# Patient Record
Sex: Male | Born: 1990
Health system: Southern US, Community
[De-identification: ages and names within clinical notes are randomized; demographics above are authoritative.]

## PROBLEM LIST (undated history)

## (undated) DIAGNOSIS — L309 Dermatitis, unspecified: Secondary | ICD-10-CM

---

## 2015-06-22 ENCOUNTER — Encounter (HOSPITAL_COMMUNITY): Payer: Self-pay | Admitting: *Deleted

## 2015-06-22 ENCOUNTER — Emergency Department (HOSPITAL_COMMUNITY)
Admission: EM | Admit: 2015-06-22 | Discharge: 2015-06-22 | Disposition: A | Discharge status: Home / Self Care | Payer: BC Managed Care – PPO | Attending: Emergency Medicine | Admitting: Emergency Medicine

## 2015-06-22 ENCOUNTER — Emergency Department (HOSPITAL_COMMUNITY): Payer: BC Managed Care – PPO

## 2015-06-22 DIAGNOSIS — R1012 Left upper quadrant pain: Secondary | ICD-10-CM | POA: Diagnosis not present

## 2015-06-22 DIAGNOSIS — R1011 Right upper quadrant pain: Secondary | ICD-10-CM | POA: Diagnosis not present

## 2015-06-22 DIAGNOSIS — S29011A Strain of muscle and tendon of front wall of thorax, initial encounter: Secondary | ICD-10-CM | POA: Insufficient documentation

## 2015-06-22 DIAGNOSIS — R079 Chest pain, unspecified: Secondary | ICD-10-CM | POA: Diagnosis present

## 2015-06-22 DIAGNOSIS — M25512 Pain in left shoulder: Secondary | ICD-10-CM | POA: Insufficient documentation

## 2015-06-22 DIAGNOSIS — Y999 Unspecified external cause status: Secondary | ICD-10-CM | POA: Insufficient documentation

## 2015-06-22 DIAGNOSIS — Y939 Activity, unspecified: Secondary | ICD-10-CM | POA: Insufficient documentation

## 2015-06-22 DIAGNOSIS — M25511 Pain in right shoulder: Secondary | ICD-10-CM | POA: Insufficient documentation

## 2015-06-22 DIAGNOSIS — X58XXXA Exposure to other specified factors, initial encounter: Secondary | ICD-10-CM | POA: Insufficient documentation

## 2015-06-22 DIAGNOSIS — Y929 Unspecified place or not applicable: Secondary | ICD-10-CM | POA: Insufficient documentation

## 2015-06-22 DIAGNOSIS — T148XXA Other injury of unspecified body region, initial encounter: Secondary | ICD-10-CM

## 2015-06-22 LAB — COMPREHENSIVE METABOLIC PANEL
ALBUMIN: 3.8 g/dL (ref 3.5–5.0)
ALK PHOS: 68 U/L (ref 38–126)
ALT: 19 U/L (ref 17–63)
AST: 28 U/L (ref 15–41)
Anion gap: 10 (ref 5–15)
BILIRUBIN TOTAL: 0.7 mg/dL (ref 0.3–1.2)
BUN: 5 mg/dL — ABNORMAL LOW (ref 6–20)
CALCIUM: 9.7 mg/dL (ref 8.9–10.3)
CO2: 26 mmol/L (ref 22–32)
CREATININE: 0.91 mg/dL (ref 0.61–1.24)
Chloride: 103 mmol/L (ref 101–111)
GFR calc non Af Amer: >60 mL/min (ref >60)
GLUCOSE: 82 mg/dL (ref 65–99)
Potassium: 4.1 mmol/L (ref 3.5–5.1)
SODIUM: 139 mmol/L (ref 135–145)
TOTAL PROTEIN: 7 g/dL (ref 6.5–8.1)

## 2015-06-22 LAB — CBC WITH DIFFERENTIAL/PLATELET
BASOS PCT: 0 %
Basophils Absolute: 0 10*3/uL (ref 0.0–0.1)
EOS ABS: 2.5 10*3/uL — AB (ref 0.0–0.7)
EOS PCT: 24 %
HCT: 43.2 % (ref 39.0–52.0)
Hemoglobin: 14.5 g/dL (ref 13.0–17.0)
LYMPHS ABS: 1.7 10*3/uL (ref 0.7–4.0)
Lymphocytes Relative: 16 %
MCH: 27.3 pg (ref 26.0–34.0)
MCHC: 33.6 g/dL (ref 30.0–36.0)
MCV: 81.2 fL (ref 78.0–100.0)
MONO ABS: 0.5 10*3/uL (ref 0.1–1.0)
MONOS PCT: 5 %
Neutro Abs: 5.8 10*3/uL (ref 1.7–7.7)
Neutrophils Relative %: 55 %
PLATELETS: 309 10*3/uL (ref 150–400)
RBC: 5.32 MIL/uL (ref 4.22–5.81)
RDW: 12.8 % (ref 11.5–15.5)
WBC: 10.6 10*3/uL — ABNORMAL HIGH (ref 4.0–10.5)

## 2015-06-22 LAB — URINALYSIS, ROUTINE W REFLEX MICROSCOPIC
BILIRUBIN URINE: NEGATIVE
Glucose, UA: NEGATIVE mg/dL
HGB URINE DIPSTICK: NEGATIVE
KETONES UR: 15 mg/dL — AB
Leukocytes, UA: NEGATIVE
Nitrite: NEGATIVE
PROTEIN: NEGATIVE mg/dL
SPECIFIC GRAVITY, URINE: 1.017 (ref 1.005–1.030)
pH: 5.5 (ref 5.0–8.0)

## 2015-06-22 LAB — LIPASE, BLOOD: Lipase: 35 U/L (ref 11–51)

## 2015-06-22 LAB — CK: CK TOTAL: 96 U/L (ref 49–397)

## 2015-06-22 MED ORDER — KETOROLAC TROMETHAMINE 30 MG/ML IJ SOLN
30.0000 mg | Freq: Once | INTRAMUSCULAR | Status: AC
  Administered 2015-06-22: 30 mg via INTRAMUSCULAR
  Filled 2015-06-22: qty 1

## 2015-06-22 MED ORDER — TRAMADOL HCL 50 MG PO TABS
50.0000 mg | ORAL_TABLET | Freq: Once | ORAL | Status: AC
  Administered 2015-06-22: 50 mg via ORAL
  Filled 2015-06-22: qty 1

## 2015-06-22 MED ORDER — TRAMADOL HCL 50 MG PO TABS: 50.0000 mg | ORAL_TABLET | Freq: Four times a day (QID) | ORAL | Status: DC | PRN

## 2015-06-22 MED ORDER — PREDNISONE 10 MG PO TABS: ORAL_TABLET | ORAL | Status: DC

## 2015-06-22 NOTE — ED Provider Notes (Signed)
CSN: [id number]     Arrival date & time 06/22/15  1535 History   First MD Initiated Contact with Patient 06/22/15 1550     Chief Complaint  Patient presents with  . Abdominal Pain     (Consider location/radiation/quality/duration/timing/severity/associated sxs/prior Treatment) The history is provided by the patient.   Joshua Bentley is a 25 y.o. male presenting with a now almost 4 week history of abdominal chest and bilateral shoulder pain.  He describes a sharp and burning sensation in his abdomen which radiates into his upper chest and bilateral anterior shoulders with movement, no pain at rest.  [facility] was [facility] early this month at which time he had an abdominal CT scan along with basic labs which was negative for any acute processes, and was diagnosed with probable body wall muscular pain.  However he denies any reason for this diagnosis and persistent pain.  He does work in [employer] and is very active with his job with frequent lifting and bending, but is done this for a year without symptoms until now.  He denies fevers or chills, denies shortness of breath, wheezing, nausea or emesis.  Also denies dysuria, dark urine, light stools, diarrhea or constipation.  He was prescribed ibuprofen and Flexeril at his recent workup which has not improved his symptoms.    History reviewed. No pertinent past medical history. History reviewed. No pertinent past surgical history. History reviewed. No pertinent family history. Social History  Substance Use Topics  . Smoking status: Never Smoker   . Smokeless tobacco: Never Used  . Alcohol Use: Yes     Comment: social    Review of Systems  Constitutional: Negative for fever and chills.  HENT: Negative.   Respiratory: Negative for cough, shortness of breath and wheezing.   Cardiovascular: Positive for chest pain.  Gastrointestinal: Positive for abdominal pain. Negative for nausea, vomiting, diarrhea and constipation.    Musculoskeletal: Positive for arthralgias. Negative for joint swelling.  Skin: Negative for color change and rash.  Neurological: Negative for weakness and numbness.      Allergies  Review of patient's allergies indicates no known allergies.  Home Medications   Prior to Admission medications   Medication Sig Start Date End Date Taking? Authorizing Provider  predniSONE (DELTASONE) 10 MG tablet 6, 5, 4, 3, 2 then 1 tablet by mouth daily for 6 days total. 06/22/15   Burgess AmorJulie Yazaira Speas, PA-C  traMADol (ULTRAM) 50 MG tablet Take 1 tablet (50 mg total) by mouth every 6 (six) hours as needed. 06/22/15   Burgess AmorJulie Alys Dulak, PA-C   BP 147/83 mmHg  Pulse 86  Temp(Src) 98 F (36.7 C) (Oral)  Resp 20  SpO2 100% Physical Exam  Constitutional: He appears well-developed and well-nourished.  HENT:  Head: Normocephalic and atraumatic.  Eyes: Conjunctivae are normal.  Neck: Normal range of motion.  Cardiovascular: Normal rate, regular rhythm, S1 normal, normal heart sounds and intact distal pulses.   Pulmonary/Chest: Effort normal and breath sounds normal. He has no wheezes. He exhibits tenderness and bony tenderness. He exhibits no crepitus, no edema, no deformity and no swelling.    Tender to palpation bilateral upper anterior chest wall.  No edema, no rash.  Abdominal: Soft. Bowel sounds are normal. He exhibits no distension and no mass. There is no hepatosplenomegaly. There is tenderness in the right upper quadrant and left upper quadrant. There is no rebound, no guarding, no CVA tenderness, no tenderness at McBurney's point and negative Murphy's  sign.  Musculoskeletal: Normal range of motion.  Tender to palpation bilateral anterior shoulders.  There is no edema, crepitus, erythema.  He displays full range of motion which does exacerbate his shoulder and upper chest discomfort.  Neurological: He is alert.  Skin: Skin is warm and dry.  Psychiatric: He has a normal mood and affect.  Nursing note and vitals  reviewed.   ED Course  Procedures (including critical care time) Labs Review Labs Reviewed  COMPREHENSIVE METABOLIC PANEL - Abnormal; Notable for the following:    BUN 5 (*)    All other components within normal limits  CBC WITH DIFFERENTIAL/PLATELET - Abnormal; Notable for the following:    WBC 10.6 (*)    Eosinophils Absolute 2.5 (*)    All other components within normal limits  URINALYSIS, ROUTINE W REFLEX MICROSCOPIC (NOT AT [facility]) - Abnormal; Notable for the following:    Ketones, ur 15 (*)    All other components within normal limits  LIPASE, BLOOD  CK    Imaging Review Dg Abd Acute W/chest  06/22/2015  CLINICAL DATA:  Chest and abdominal pain for 2 weeks. EXAM: DG ABDOMEN ACUTE W/ 1V CHEST COMPARISON:  None. FINDINGS: There is no evidence of dilated bowel loops or free intraperitoneal air. No abnormal stool retention. No radiopaque calculi or other significant radiographic abnormality is seen. Heart size and mediastinal contours are within normal limits. Both lungs are clear. Mild thoracic dextroscoliosis. IMPRESSION: Negative abdominal radiographs.  No acute cardiopulmonary disease. Electronically Signed   By: Marnee Spring M.D.   On: 06/22/2015 18:43   I have personally reviewed and evaluated these images and lab results as part of my medical decision-making.   EKG Interpretation None      MDM   Final diagnoses:  Musculoskeletal strain    Suspect symptoms related to chest wall musculoskeletal strain. Sx are reproducible on exam including upper chest wall and anterior shoulder.   However given chronicity of symptoms and upper abdominal pain we'll check basic labs including total CK.  I have requested discharge summary and results of CT scanning that was completed at the outside hospital for same symptoms earlier this month.  Will prescribe tramadol, prednisone taper.  Advised heat tx.  Referrals given to establish pcp in this area.  Report from [facility]  including CT abdomen report obtained and negative work up.    Burgess Amor, PA-C 06/22/15 2028  Laurence Spates, MD 06/23/15 [phone]

## 2015-06-22 NOTE — ED Notes (Addendum)
Pt reports lt shoulder pain since 06-04-15. Pt reports he lifts heavy objects at work .  [facility]  [facility] on 06-05-15. Pt now reports pain has moved from stomach to Lt shoulder.

## 2016-07-08 ENCOUNTER — Encounter (HOSPITAL_COMMUNITY)
Admission: EM | Disposition: A | Discharge status: Home / Self Care | Payer: Self-pay | Source: Home / Self Care | Attending: Emergency Medicine

## 2016-07-08 ENCOUNTER — Ambulatory Visit (HOSPITAL_COMMUNITY)
Admission: EM | Admit: 2016-07-08 | Discharge: 2016-07-08 | Disposition: A | Discharge status: Home / Self Care | Payer: BC Managed Care – PPO | Attending: Emergency Medicine | Admitting: Emergency Medicine

## 2016-07-08 ENCOUNTER — Emergency Department (HOSPITAL_COMMUNITY): Payer: BC Managed Care – PPO

## 2016-07-08 ENCOUNTER — Encounter (HOSPITAL_COMMUNITY): Payer: Self-pay | Admitting: Emergency Medicine

## 2016-07-08 DIAGNOSIS — T18108A Unspecified foreign body in esophagus causing other injury, initial encounter: Secondary | ICD-10-CM

## 2016-07-08 DIAGNOSIS — R0989 Other specified symptoms and signs involving the circulatory and respiratory systems: Secondary | ICD-10-CM | POA: Diagnosis not present

## 2016-07-08 DIAGNOSIS — K222 Esophageal obstruction: Secondary | ICD-10-CM | POA: Diagnosis not present

## 2016-07-08 DIAGNOSIS — Z0389 Encounter for observation for other suspected diseases and conditions ruled out: Secondary | ICD-10-CM | POA: Diagnosis not present

## 2016-07-08 DIAGNOSIS — K449 Diaphragmatic hernia without obstruction or gangrene: Secondary | ICD-10-CM | POA: Insufficient documentation

## 2016-07-08 HISTORY — PX: ESOPHAGOGASTRODUODENOSCOPY: SHX5428

## 2016-07-08 SURGERY — EGD (ESOPHAGOGASTRODUODENOSCOPY)
Anesthesia: Moderate Sedation

## 2016-07-08 MED ORDER — MIDAZOLAM HCL 5 MG/ML IJ SOLN
INTRAMUSCULAR | Status: AC
  Filled 2016-07-08: qty 2

## 2016-07-08 MED ORDER — BUTAMBEN-TETRACAINE-BENZOCAINE 2-2-14 % EX AERO
INHALATION_SPRAY | CUTANEOUS | Status: DC | PRN
  Administered 2016-07-08: 2 via TOPICAL

## 2016-07-08 MED ORDER — LORAZEPAM 2 MG/ML IJ SOLN
1.0000 mg | Freq: Once | INTRAMUSCULAR | Status: AC
  Administered 2016-07-08: 1 mg via INTRAVENOUS
  Filled 2016-07-08: qty 1

## 2016-07-08 MED ORDER — MIDAZOLAM HCL 10 MG/2ML IJ SOLN
INTRAMUSCULAR | Status: DC | PRN
  Administered 2016-07-08: 1 mg via INTRAVENOUS
  Administered 2016-07-08 (×2): 2 mg via INTRAVENOUS

## 2016-07-08 MED ORDER — PANTOPRAZOLE SODIUM 40 MG PO TBEC: 20.0000 mg | DELAYED_RELEASE_TABLET | Freq: Every day | ORAL | 1 refills | Status: DC

## 2016-07-08 MED ORDER — DIPHENHYDRAMINE HCL 50 MG/ML IJ SOLN
INTRAMUSCULAR | Status: AC
  Filled 2016-07-08: qty 1

## 2016-07-08 MED ORDER — GLUCAGON HCL RDNA (DIAGNOSTIC) 1 MG IJ SOLR
1.0000 mg | Freq: Once | INTRAMUSCULAR | Status: AC
  Administered 2016-07-08: 1 mg via INTRAVENOUS
  Filled 2016-07-08: qty 1

## 2016-07-08 MED ORDER — FENTANYL CITRATE (PF) 100 MCG/2ML IJ SOLN
INTRAMUSCULAR | Status: DC | PRN
  Administered 2016-07-08 (×2): 25 ug via INTRAVENOUS

## 2016-07-08 MED ORDER — PANTOPRAZOLE SODIUM 40 MG IV SOLR
40.0000 mg | Freq: Once | INTRAVENOUS | Status: AC
  Administered 2016-07-08: 40 mg via INTRAVENOUS
  Filled 2016-07-08: qty 40

## 2016-07-08 MED ORDER — SODIUM CHLORIDE 0.9 % IV SOLN
INTRAVENOUS | Status: DC
  Administered 2016-07-08: 12:00:00 via INTRAVENOUS

## 2016-07-08 MED ORDER — FENTANYL CITRATE (PF) 100 MCG/2ML IJ SOLN
INTRAMUSCULAR | Status: AC
  Filled 2016-07-08: qty 4

## 2016-07-08 NOTE — ED Notes (Signed)
ED Provider at bedside. 

## 2016-07-08 NOTE — ED Provider Notes (Signed)
[facility]-EMERGENCY DEPT Provider Note   CSN: [id number] Arrival date & time: 07/08/16  0636     History   Chief Complaint Chief Complaint  Patient presents with  . Swallowed Foreign Body    HPI Joshua Bentley is a 26 y.o. male.  HPI Patient reports swallowing chicken at approximately 1:30 last night and has been unable to swallow since then.  He is unable to keep his secretions down.  He reports his throat hurts.  He can not drink water at the bedside without the column of water coming back up.  No prior history of gastroesophageal reflux disease.  No prior history of strictures.  His girlfriend states that he takes very large bites   History reviewed. No pertinent past medical history.  There are no active problems to display for this patient.   History reviewed. No pertinent surgical history.     Home Medications    Prior to Admission medications   Medication Sig Start Date End Date Taking? Authorizing Provider  predniSONE (DELTASONE) 10 MG tablet 6, 5, 4, 3, 2 then 1 tablet by mouth daily for 6 days total. Patient not taking: Reported on 07/08/2016 06/22/15   Burgess Amor, PA-C  traMADol (ULTRAM) 50 MG tablet Take 1 tablet (50 mg total) by mouth every 6 (six) hours as needed. Patient not taking: Reported on 07/08/2016 06/22/15   Burgess Amor, PA-C    Family History History reviewed. No pertinent family history.  Social History Social History  Substance Use Topics  . Smoking status: Never Smoker  . Smokeless tobacco: Never Used  . Alcohol use Yes     Comment: social     Allergies   Patient has no known allergies.   Review of Systems Review of Systems  All other systems reviewed and are negative.    Physical Exam Updated Vital Signs There were no vitals taken for this visit.  Physical Exam  Constitutional: He is oriented to person, place, and time. He appears well-developed and well-nourished.  HENT:  Head: Normocephalic.  Posterior pharynx is normal.   Uvula normal.  Eyes: EOM are normal.  Neck: Normal range of motion. Neck supple.  Cardiovascular: Normal rate.   Pulmonary/Chest: Effort normal.  Abdominal: He exhibits no distension.  Musculoskeletal: Normal range of motion.  Neurological: He is alert and oriented to person, place, and time.  Psychiatric: He has a normal mood and affect.  Nursing note and vitals reviewed.    ED Treatments / Results  Labs (all labs ordered are listed, but only abnormal results are displayed) Labs Reviewed - No data to display  EKG  EKG Interpretation None       Radiology Dg Chest 2 View  Result Date: 07/08/2016 CLINICAL DATA:  Dysphagia EXAM: CHEST  2 VIEW COMPARISON:  Jun 22, 2015 FINDINGS: The lungs are clear. The heart size and pulmonary vascularity are normal. No adenopathy. No pneumothorax or pneumomediastinum. No mediastinal widening evident. Bony structures appear unremarkable. There is mid thoracic dextroscoliosis. IMPRESSION: No edema or consolidation. No pneumomediastinum or pneumothorax. Stable cardiac and mediastinal silhouette. Electronically Signed   By: Bretta Bang III M.D.   On: 07/08/2016 07:46    Procedures Procedures (including critical care time)  Medications Ordered in ED Medications  glucagon (human recombinant) (GLUCAGEN) injection 1 mg (1 mg Intravenous Given 07/08/16 0804)  LORazepam (ATIVAN) injection 1 mg (1 mg Intravenous Given 07/08/16 0804)     Initial Impression / Assessment and Plan / ED Course  I have reviewed the  triage vital signs and the nursing notes.  Pertinent labs & imaging results that were available during my care of the patient were reviewed by me and considered in my medical decision making (see chart for details).     Posterior pharynx and anterior neck are normal.  Chest x-ray normal.  No response to glucagon and benzodiazepine.  Attempt to give the patient water and as he drank the water it began to stay down but then shortly after an  entire column of water came back up into the vomit bag.  I spoke with on-call [facility] gastroenterology who will evaluate the patient at bedside for possible upper endoscopy for esophageal food impaction  Final Clinical Impressions(s) / ED Diagnoses   Final diagnoses:  None    New Prescriptions New Prescriptions   No medications on file     Azalia Bilisampos, Rhoderick Farrel, MD 07/08/16 [phone]

## 2016-07-08 NOTE — Consult Note (Signed)
Referring Provider: Dr. Roylene Reasonampos/ER Primary Care Physician:  Patient, No Pcp Per Primary Gastroenterologist:  Gentry FitzUnassigned  Reason for Consultation:  Food impaction  HPI: Joshua Bentley is a 26 y.o. male [facility] with sensation of food stuck in the esophagus. Patient was retained chicken at around 1:30 in the morning and felt it is not going down. Patient was initially doing fine then started having nausea and vomiting around 5:30 AM. He was brought into the ER. He was given trial of liquid which resulted in vomiting. Patient seen and examined at bedside. He continues to have sensation of something stuck in the esophagus.  Denied any other GI symptoms. Denied history of trouble swallowing in the past. Denied any previous endoscopic workup. Denied NSAID use. Patient is complaining of fatigue because he hasn't slept overnight.  No family history of colon cancer.  History reviewed. No pertinent past medical history.  History reviewed. No pertinent surgical history.  Prior to Admission medications   Medication Sig Start Date End Date Taking? Authorizing Provider  predniSONE (DELTASONE) 10 MG tablet 6, 5, 4, 3, 2 then 1 tablet by mouth daily for 6 days total. Patient not taking: Reported on 07/08/2016 06/22/15   Burgess AmorIdol, Julie, PA-C  traMADol (ULTRAM) 50 MG tablet Take 1 tablet (50 mg total) by mouth every 6 (six) hours as needed. Patient not taking: Reported on 07/08/2016 06/22/15   Burgess AmorIdol, Julie, PA-C    Scheduled Meds: . pantoprazole (PROTONIX) IV  40 mg Intravenous Once   Continuous Infusions: PRN Meds:.  Allergies as of 07/08/2016  . (No Known Allergies)    History reviewed. No pertinent family history.  Social History   Social History  . Marital status: Single    Spouse name: N/A  . Number of children: N/A  . Years of education: N/A   Occupational History  . Not on file.   Social History Main Topics  . Smoking status: Never Smoker  . Smokeless  tobacco: Never Used  . Alcohol use Yes     Comment: social  . Drug use: No  . Sexual activity: Not on file   Other Topics Concern  . Not on file   Social History Narrative  . No narrative on file    Review of Systems:  Review of Systems  Constitutional: Positive for malaise/fatigue. Negative for chills, fever and weight loss.  HENT: Negative for ear discharge, ear pain, hearing loss and tinnitus.   Eyes: Negative for blurred vision, double vision, photophobia and pain.  Respiratory: Negative for cough and hemoptysis.   Cardiovascular: Negative for chest pain and palpitations.  Gastrointestinal: Positive for abdominal pain, nausea and vomiting. Negative for constipation and diarrhea.  Genitourinary: Negative for dysuria and urgency.  Musculoskeletal: Negative for myalgias and neck pain.  Skin: Negative for rash.  Neurological: Negative for speech change, focal weakness and seizures.  Endo/Heme/Allergies: Does not bruise/bleed easily.  Psychiatric/Behavioral: Negative for hallucinations and suicidal ideas.    Physical Exam: Vital signs: There were no vitals filed for this visit.   General:   Alert, somewhat sleepy but able to make conversation Well-developed, well-nourished, pleasant and cooperative in NAD HEENT: Normocephalic, atraumatic, extraocular movement intact. No oral lesion. Lungs:  Clear throughout to auscultation.   No wheezes, crackles, or rhonchi. No acute distress. Heart:  Regular rate and rhythm; no murmurs, clicks, rubs,  or gallops. Abdomen: Soft, nontender, nondistended, bowel stent present LE: No edema. Pulses present Psych. Mood and affect normal. Judgment normal  GI:  Lab Results: No results for input(s): WBC, HGB, HCT, PLT in the last 72 hours. BMET No results for input(s): NA, K, CL, CO2, GLUCOSE, BUN, CREATININE, CALCIUM in the last 72 hours. LFT No results for input(s): PROT, ALBUMIN, AST, ALT, ALKPHOS, BILITOT, BILIDIR, IBILI in the last 72  hours. PT/INR No results for input(s): LABPROT, INR in the last 72 hours.   Studies/Results: Dg Chest 2 View  Result Date: 07/08/2016 CLINICAL DATA:  Dysphagia EXAM: CHEST  2 VIEW COMPARISON:  Jun 22, 2015 FINDINGS: The lungs are clear. The heart size and pulmonary vascularity are normal. No adenopathy. No pneumothorax or pneumomediastinum. No mediastinal widening evident. Bony structures appear unremarkable. There is mid thoracic dextroscoliosis. IMPRESSION: No edema or consolidation. No pneumomediastinum or pneumothorax. Stable cardiac and mediastinal silhouette. Electronically Signed   By: Bretta Bang III M.D.   On: 07/08/2016 07:46    Impression/Plan: - Food impaction.  Recommendations -------------------------- - EGD today. Risks benefits alternatives discussed with the patient and girlfriend. Verbalized understanding. - According to girlfriend, patient has braces and he is not chewing his food properly and is eating large bites. Patient was advised to chew food well and moist food before swallowing. Further plan based on endoscopic finding    LOS: 0 days   Kathi Der  MD, FACP 07/08/2016, 11:06 AM  Pager [phone] If no answer or after 5 PM call [phone]

## 2016-07-08 NOTE — ED Triage Notes (Signed)
0130 While at work pt ate chicken ad felt like it did not go completely down his throat.0530 pt began having increased difficulty swallowing and having increases throat discomfort

## 2016-07-08 NOTE — Brief Op Note (Addendum)
07/08/2016  12:59 PM  PATIENT:  Joshua Bentley  26 y.o. male  PRE-OPERATIVE DIAGNOSIS:  Food impaction  POST-OPERATIVE DIAGNOSIS:  Lower esophageal ring/ no food bolus  PROCEDURE:  Procedure(s): ESOPHAGOGASTRODUODENOSCOPY (EGD) (N/A)  SURGEON:  Surgeon(s) and Role:    * Navneet Schmuck, MD - Primary  Findings/recommendations -------------------------------------- - EGD showed small amount of liquid in the esophagus which was suctioned. I did not identify any food bolus. -  patent lower esophageal ring - Procedure was difficult because of poor patient cooperation and agitation. Dilatation was not performed because of poor patient cooperation. - Recommend once a day PPI for  lower esophageal ring. - Given retained fluid in the esophagus, patient may have a motility disorder. - Recommend follow-up in GI clinic in 4-6 weeks after discharge. - May need outpatient motility study as well as esophageal dilatation if symptoms persist - GI will sign off. Call us back if needed. Okay to discharge from GI standpoint    Kathi DerParag Shian Goodnow MD, FACP 07/08/2016, 1:01 PM  Pager [phone]  If no answer or after 5 PM call [phone]

## 2016-07-08 NOTE — ED Notes (Signed)
Bed: EA54WA23 Expected date:  Expected time:  Means of arrival:  Comments: Endo

## 2016-07-08 NOTE — ED Notes (Signed)
Endo at bedside

## 2016-07-08 NOTE — Op Note (Signed)
[facility] Patient Name: Joshua Bentley Procedure Date: 07/08/2016 MRN: [id number] Attending MD: Kathi Der , MD Date of Birth: 1991-01-19 CSN: [id number] Age: 26 Admit Type: Inpatient Procedure:                Upper GI endoscopy Indications:              Foreign body in the esophagus Providers:                Kathi Der, MD, Carin Hock, RN, Margo Aye, Technician Referring MD:              Medicines:                Fentanyl 50 micrograms IV, Midazolam 5 mg IV Complications:            No immediate complications. Estimated Blood Loss:     Estimated blood loss: none. Procedure:                Pre-Anesthesia Assessment:                           - Prior to the procedure, a History and Physical                            was performed, and patient medications and                            allergies were reviewed. The patient's tolerance of                            previous anesthesia was also reviewed. The risks                            and benefits of the procedure and the sedation                            options and risks were discussed with the patient.                            All questions were answered, and informed consent                            was obtained. Prior Anticoagulants: The patient has                            taken no previous anticoagulant or antiplatelet                            agents. ASA Grade Assessment: I - A normal, healthy                            patient. After reviewing the risks and benefits,  the patient was deemed in satisfactory condition to                            undergo the procedure.                           After obtaining informed consent, the endoscope was                            passed under direct vision. Throughout the                            procedure, the patient's blood pressure, pulse, and   oxygen saturations were monitored continuously. The                            [id number]) scope was introduced through the                            mouth, and advanced to the second part of duodenum.                            The upper GI endoscopy was technically difficult                            and complex due to the patient's discomfort during                            the procedure, the patient's poor cooperation and                            the patient's agitation. The patient tolerated the                            procedure fairly well. Scope In: Scope Out: Findings:      Fluid was found in the middle third of the esophagus.      A widely patent Schatzki ring (acquired) was found at the       gastroesophageal junction.      There is no endoscopic evidence of esophagitis, stenosis, stricture or       ulcerations in the entire esophagus.      A small hiatal hernia was present.      The entire examined stomach was normal.      The cardia and gastric fundus were normal on retroflexion.      The duodenal bulb, first portion of the duodenum and second portion of       the duodenum were normal. Impression:               - Fluid in the middle third of the esophagus.                           - Widely patent Schatzki ring.                           - Small hiatal hernia.                           -  Normal stomach.                           - Normal duodenal bulb, first portion of the                            duodenum and second portion of the duodenum.                           - No specimens collected. Moderate Sedation:      Moderate (conscious) sedation was administered by the endoscopy nurse       and supervised by the endoscopist. The following parameters were       monitored: oxygen saturation, heart rate, blood pressure, and response       to care. Recommendation:           - Patient has a contact number available for                            emergencies.  The signs and symptoms of potential                            delayed complications were discussed with the                            patient. Return to normal activities tomorrow.                            Written discharge instructions were provided to the                            patient.                           - Resume previous diet.                           - Use Protonix (pantoprazole) 40 mg PO daily for 8                            weeks.                           - Soft diet.                           - Return to my office in 6 weeks.                           - Repeat upper endoscopy PRN for retreatment.                           - Perform ambulatory esophageal manometry if                            symptoms persist.                           -  Continue present medications.                           - Discontinue aspirin and NSAIDs. Procedure Code(s):        --- Professional ---                           [phone], Esophagogastroduodenoscopy, flexible,                            transoral; diagnostic, including collection of                            specimen(s) by brushing or washing, when performed                            (separate procedure) Diagnosis Code(s):        --- Professional ---                           K22.2, Esophageal obstruction                           K44.9, Diaphragmatic hernia without obstruction or                            gangrene                           T18.108A, Unspecified foreign body in esophagus                            causing other injury, initial encounter CPT copyright 2016 [employer]. All rights reserved. The codes documented in this report are preliminary and upon coder review may  be revised to meet current compliance requirements. Kathi Der, MD Kathi Der, MD 07/08/2016 12:59:02 PM Number of Addenda: 0

## 2016-07-09 ENCOUNTER — Encounter (HOSPITAL_COMMUNITY): Payer: Self-pay | Admitting: Gastroenterology

## 2017-02-04 ENCOUNTER — Encounter (HOSPITAL_COMMUNITY): Payer: Self-pay | Admitting: Emergency Medicine

## 2017-02-04 ENCOUNTER — Emergency Department (HOSPITAL_COMMUNITY)
Admission: EM | Admit: 2017-02-04 | Discharge: 2017-02-04 | Disposition: A | Discharge status: Home / Self Care | Payer: BC Managed Care – PPO | Attending: Emergency Medicine | Admitting: Emergency Medicine

## 2017-02-04 DIAGNOSIS — R634 Abnormal weight loss: Secondary | ICD-10-CM | POA: Insufficient documentation

## 2017-02-04 DIAGNOSIS — L309 Dermatitis, unspecified: Secondary | ICD-10-CM | POA: Insufficient documentation

## 2017-02-04 HISTORY — DX: Dermatitis, unspecified: L30.9

## 2017-02-04 LAB — CBC WITH DIFFERENTIAL/PLATELET
BASOS PCT: 1 %
Basophils Absolute: 0.1 10*3/uL (ref 0.0–0.1)
EOS ABS: 2.1 10*3/uL — AB (ref 0.0–0.7)
Eosinophils Relative: 28 %
HCT: 41.7 % (ref 39.0–52.0)
Hemoglobin: 14.2 g/dL (ref 13.0–17.0)
LYMPHS ABS: 1.6 10*3/uL (ref 0.7–4.0)
Lymphocytes Relative: 21 %
MCH: 28.5 pg (ref 26.0–34.0)
MCHC: 34.1 g/dL (ref 30.0–36.0)
MCV: 83.6 fL (ref 78.0–100.0)
MONO ABS: 0.4 10*3/uL (ref 0.1–1.0)
Monocytes Relative: 6 %
NEUTROS PCT: 44 %
Neutro Abs: 3.2 10*3/uL (ref 1.7–7.7)
PLATELETS: 352 10*3/uL (ref 150–400)
RBC: 4.99 MIL/uL (ref 4.22–5.81)
RDW: 12.8 % (ref 11.5–15.5)
WBC: 7.4 10*3/uL (ref 4.0–10.5)

## 2017-02-04 LAB — COMPREHENSIVE METABOLIC PANEL
ALT: 14 U/L — AB (ref 17–63)
ANION GAP: 6 (ref 5–15)
AST: 22 U/L (ref 15–41)
Albumin: 4.1 g/dL (ref 3.5–5.0)
Alkaline Phosphatase: 58 U/L (ref 38–126)
BUN: 9 mg/dL (ref 6–20)
CHLORIDE: 105 mmol/L (ref 101–111)
CO2: 26 mmol/L (ref 22–32)
Calcium: 9.1 mg/dL (ref 8.9–10.3)
Creatinine, Ser: 0.68 mg/dL (ref 0.61–1.24)
Glucose, Bld: 92 mg/dL (ref 65–99)
POTASSIUM: 3.8 mmol/L (ref 3.5–5.1)
SODIUM: 137 mmol/L (ref 135–145)
Total Bilirubin: 0.4 mg/dL (ref 0.3–1.2)
Total Protein: 7.7 g/dL (ref 6.5–8.1)

## 2017-02-04 MED ORDER — PREDNISONE 50 MG PO TABS: ORAL_TABLET | ORAL | 0 refills | Status: DC

## 2017-02-04 MED ORDER — PREDNISONE 20 MG PO TABS
60.0000 mg | ORAL_TABLET | Freq: Once | ORAL | Status: AC
  Administered 2017-02-04: 60 mg via ORAL
  Filled 2017-02-04: qty 3

## 2017-02-04 NOTE — Discharge Instructions (Signed)
Please follow with your primary care doctor in the next 2 days for a check-up. They must obtain records for further management.  ° °Do not hesitate to return to the Emergency Department for any new, worsening or concerning symptoms.  ° °

## 2017-02-04 NOTE — ED Provider Notes (Signed)
[facility]-EMERGENCY DEPT Provider Note   CSN: [id number] Arrival date & time: 02/04/17  1257     History   Chief Complaint Chief Complaint  Patient presents with  . Pruritis  . Anorexia    HPI   Blood pressure 136/81, pulse 77, temperature 98.3 F (36.8 C), temperature source Oral, resp. rate 14, height 5\' 9"  (1.753 m), weight 74.2 kg (163 lb 8 oz), SpO2 100 %.  Joshua Bentley is a 27 y.o. male complaining of eczema exacerbation with associated pruritus he also has myalgia he was told was secondary to the inflammation from his eczema, he saw a dermatologist to give him a prescription for prednisone this resolved the myalgia and joint pain of note, patient states that he has had an unintentional 30 pound weight loss in the last 2-3 months.  He denies any cough, night sweats, easy bruising or bleeding.  He is not a smoker.  He denies any focal chest pain or abdominal pain.   Past Medical History:  Diagnosis Date  . Eczema     There are no active problems to display for this patient.   Past Surgical History:  Procedure Laterality Date  . ESOPHAGOGASTRODUODENOSCOPY N/A 07/08/2016   Procedure: ESOPHAGOGASTRODUODENOSCOPY (EGD);  Surgeon: Kathi Der, MD;  Location: [facility];  Service: Gastroenterology;  Laterality: N/A;       Home Medications    Prior to Admission medications   Medication Sig Start Date End Date Taking? Authorizing Provider  predniSONE (DELTASONE) 50 MG tablet Take 1 tablet Bentley with breakfast 02/04/17   Milad Bublitz, Joni Reining, PA-C    Family History No family history on file.  Social History Social History   Tobacco Use  . Smoking status: Never Smoker  . Smokeless tobacco: Never Used  Substance Use Topics  . Alcohol use: Yes    Comment: social  . Drug use: No     Allergies   Patient has no known allergies.   Review of Systems Review of Systems  A complete review of systems was obtained and all systems are  negative except as noted in the HPI and PMH.    Physical Exam Updated Vital Signs BP 136/81   Pulse 77   Temp 98.3 F (36.8 C) (Oral)   Resp 14   Ht 5\' 9"  (1.753 m)   Wt 74.2 kg (163 lb 8 oz)   SpO2 100%   BMI 24.14 kg/m   Physical Exam  Constitutional: He is oriented to person, place, and time. He appears well-developed and well-nourished. No distress.  HENT:  Head: Normocephalic and atraumatic.  Mouth/Throat: Oropharynx is clear and moist.  Eyes: Conjunctivae and EOM are normal. Pupils are equal, round, and reactive to light.  Neck: Normal range of motion.  Cardiovascular: Normal rate, regular rhythm and intact distal pulses.  Pulmonary/Chest: Effort normal and breath sounds normal.  Abdominal: Soft. There is no tenderness.  Musculoskeletal: Normal range of motion.  Neurological: He is alert and oriented to person, place, and time.  Skin: He is not diaphoretic.  Severe eczema over extensor surfaces  Psychiatric: He has a normal mood and affect.  Nursing note and vitals reviewed.    ED Treatments / Results  Labs (all labs ordered are listed, but only abnormal results are displayed) Labs Reviewed  CBC WITH DIFFERENTIAL/PLATELET - Abnormal; Notable for the following components:      Result Value   Eosinophils Absolute 2.1 (*)    All other components within normal limits  COMPREHENSIVE METABOLIC  PANEL - Abnormal; Notable for the following components:   ALT 14 (*)    All other components within normal limits    EKG  EKG Interpretation None       Radiology No results found.  Procedures Procedures (including critical care time)  Medications Ordered in ED Medications  predniSONE (DELTASONE) tablet 60 mg (60 mg Oral Given 02/04/17 1416)     Initial Impression / Assessment and Plan / ED Course  I have reviewed the triage vital signs and the nursing notes.  Pertinent labs & imaging results that were available during my care of the patient were reviewed by me  and considered in my medical decision making (see chart for details).     Vitals:   02/04/17 1326 02/04/17 1328  BP:  136/81  Pulse:  77  Resp:  14  Temp:  98.3 F (36.8 C)  TempSrc:  Oral  SpO2:  100%  Weight: 74.2 kg (163 lb 8 oz)   Height: 5\' 9"  (1.753 m)     Medications  predniSONE (DELTASONE) tablet 60 mg (60 mg Oral Given 02/04/17 1416)    Joshua Bentley is 27 y.o. male presenting with eczema exacerbation with unintentional weight loss, no other B symptoms.  Physical exam vital signs reassuring, patient has no outpatient primary care, case management consulted to try to establish primary care for further workup of his unintentional weight loss, he is given a referral to the wellness center.  Will start prednisone burst for treatment of his eczema.   Evaluation does not show pathology that would require ongoing emergent intervention or inpatient treatment. Pt is hemodynamically stable and mentating appropriately. Discussed findings and plan with patient/guardian, who agrees with care plan. All questions answered. Return precautions discussed and outpatient follow up given.      Final Clinical Impressions(s) / ED Diagnoses   Final diagnoses:  Unintentional weight loss  Eczema, unspecified type    ED Discharge Orders        Ordered    predniSONE (DELTASONE) 50 MG tablet     02/04/17 1517       Elana Jian, Mardella Laymanicole, PA-C 02/04/17 1608    Derwood KaplanNanavati, Ankit, MD 02/05/17 [phone]

## 2017-02-04 NOTE — Care Management Note (Signed)
Case Management Note  CM consulted for no pcp and no ins.  Spoke with pt and discussed the two clinic options that would be on his AVS.  Advised him to contact them and schedule an appointment for follow up.  Pt understood.  No further CM needs noted at this time.

## 2017-02-04 NOTE — ED Triage Notes (Signed)
Patient c/o generalized pain and itching since August last year. States he saw dermatologist and was given prednisone for excema. Pt c/o lack of appetite and weight loss due to now eating.

## 2017-02-05 ENCOUNTER — Telehealth: Payer: Self-pay

## 2017-02-05 NOTE — Telephone Encounter (Signed)
Message received from Eldridge AbrahamsAngela Kritzer, RN CM noting the the patient needs a hospital follow up appointment. It is noted [facility]  Update provided to Breck CoonsA. Kritzer, RN CM

## 2017-02-12 ENCOUNTER — Encounter: Payer: Self-pay | Admitting: Family Medicine

## 2017-02-12 ENCOUNTER — Ambulatory Visit (INDEPENDENT_AMBULATORY_CARE_PROVIDER_SITE_OTHER): Payer: Self-pay | Admitting: Family Medicine

## 2017-02-12 VITALS — BP 124/80 | HR 80 | Temp 98.1°F | Resp 16 | Ht 69.0 in | Wt 159.4 lb

## 2017-02-12 DIAGNOSIS — L2082 Flexural eczema: Secondary | ICD-10-CM

## 2017-02-12 DIAGNOSIS — R634 Abnormal weight loss: Secondary | ICD-10-CM

## 2017-02-12 DIAGNOSIS — Z131 Encounter for screening for diabetes mellitus: Secondary | ICD-10-CM

## 2017-02-12 DIAGNOSIS — F329 Major depressive disorder, single episode, unspecified: Secondary | ICD-10-CM

## 2017-02-12 DIAGNOSIS — F32A Depression, unspecified: Secondary | ICD-10-CM

## 2017-02-12 LAB — POCT URINALYSIS DIP (DEVICE)
Bilirubin Urine: NEGATIVE
Glucose, UA: NEGATIVE mg/dL
HGB URINE DIPSTICK: NEGATIVE
Ketones, ur: NEGATIVE mg/dL
NITRITE: NEGATIVE
PH: 5.5 (ref 5.0–8.0)
Protein, ur: NEGATIVE mg/dL
Specific Gravity, Urine: 1.015 (ref 1.005–1.030)
UROBILINOGEN UA: 0.2 mg/dL (ref 0.0–1.0)

## 2017-02-12 LAB — POCT GLYCOSYLATED HEMOGLOBIN (HGB A1C): HEMOGLOBIN A1C: 5.1

## 2017-02-12 MED ORDER — METHYLPREDNISOLONE SODIUM SUCC 125 MG IJ SOLR
125.0000 mg | Freq: Once | INTRAMUSCULAR | Status: AC
  Administered 2017-02-12: 125 mg via INTRAMUSCULAR

## 2017-02-12 MED ORDER — TRIAMCINOLONE ACETONIDE 0.1 % EX CREA: 1.0000 "application " | TOPICAL_CREAM | Freq: Two times a day (BID) | CUTANEOUS | 0 refills | Status: DC

## 2017-02-12 MED ORDER — ESCITALOPRAM OXALATE 10 MG PO TABS: 10.0000 mg | ORAL_TABLET | Freq: Every day | ORAL | 0 refills | Status: DC

## 2017-02-12 MED ORDER — PREDNISONE 20 MG PO TABS: ORAL_TABLET | ORAL | 0 refills | Status: DC

## 2017-02-12 MED FILL — predniSONE 20 MG TABS: 20 | 18 days supply | Qty: 18 | Fill #0

## 2017-02-12 MED FILL — TRIAMCINOLONE ACETONIDE 0.1: 0.1 | 30 days supply | Qty: 454 | Fill #0

## 2017-02-12 MED FILL — ESCITALOPRAM 10 MG TABLET: 10 | 30 days supply | Qty: 30 | Fill #0

## 2017-02-12 NOTE — Patient Instructions (Addendum)
Take Prednisone 20 mg,  in mornings with breakfast as follows:  Take 3 pills for 3 days, Take 2 pills for 3 days, and Take 1 pill for 3 days.  Complete all medication.   Mix Kenalog cream with Vaseline apply to entire body.  Lexapro 10 mg once daily at bedtime for depression. If symptoms worsen or do improve, return for care. I will see you in 6 weeks for follow-up.    Atopic Dermatitis Atopic dermatitis is a skin disorder that causes inflammation of the skin. This is the most common type of eczema. Eczema is a group of skin conditions that cause the skin to be itchy, red, and swollen. This condition is generally worse during the cooler winter months and often improves during the warm summer months. Symptoms can vary from person to person. Atopic dermatitis usually starts showing signs in infancy and can last through adulthood. This condition cannot be passed from one person to another (non-contagious), but it is more common in families. Atopic dermatitis may not always be present. When it is present, it is called a flare-up. What are the causes? The exact cause of this condition is not known. Flare-ups of the condition may be triggered by:  Contact with something that you are sensitive or allergic to.  Stress.  Certain foods.  Extremely hot or cold weather.  Harsh chemicals and soaps.  Dry air.  Chlorine.  What increases the risk? This condition is more likely to develop in people who have a personal history or family history of eczema, allergies, asthma, or hay fever. What are the signs or symptoms? Symptoms of this condition include:  Dry, scaly skin.  Red, itchy rash.  Itchiness, which can be severe. This may occur before the skin rash. This can make sleeping difficult.  Skin thickening and cracking that can occur over time.  How is this diagnosed? This condition is diagnosed based on your symptoms, a medical history, and a physical exam. How is this treated? There is  no cure for this condition, but symptoms can usually be controlled. Treatment focuses on:  Controlling the itchiness and scratching. You may be given medicines, such as antihistamines or steroid creams.  Limiting exposure to things that you are sensitive or allergic to (allergens).  Recognizing situations that cause stress and developing a plan to manage stress.  If your atopic dermatitis does not get better with medicines, or if it is all over your body (widespread), a treatment using a specific type of light (phototherapy) may be used. Follow these instructions at home: Skin care  Keep your skin well-moisturized. Doing this seals in moisture and helps to prevent dryness. ? Use unscented lotions that have petroleum in them. ? Avoid lotions that contain alcohol or water. They can dry the skin.  Keep baths or showers short (less than 5 minutes) in warm water. Do not use hot water. ? Use mild, unscented cleansers for bathing. Avoid soap and bubble bath. ? Apply a moisturizer to your skin right after a bath or shower.  Do not apply anything to your skin without checking with your health care provider. General instructions  Dress in clothes made of cotton or cotton blends. Dress lightly because heat increases itchiness.  When washing your clothes, rinse your clothes twice so all of the soap is removed.  Avoid any triggers that can cause a flare-up.  Try to manage your stress.  Keep your fingernails cut short.  Avoid scratching. Scratching makes the rash and itchiness worse.  It may also result in a skin infection (impetigo) due to a break in the skin caused by scratching.  Take or apply over-the-counter and prescription medicines only as told by your health care provider.  Keep all follow-up visits as told by your health care provider. This is important.  Do not be around people who have cold sores or fever blisters. If you get the infection, it may cause your atopic dermatitis to  worsen. Contact a health care provider if:  Your itchiness interferes with sleep.  Your rash gets worse or it is not better within one week of starting treatment.  You have a fever.  You have a rash flare-up after having contact with someone who has cold sores or fever blisters. Get help right away if:  You develop pus or soft yellow scabs in the rash area. Summary  This condition causes a red rash and itchy, dry, scaly skin.  Treatment focuses on controlling the itchiness and scratching, limiting exposure to things that you are sensitive or allergic to (allergens), recognizing situations that cause stress, and developing a plan to manage stress.  Keep your skin well-moisturized.  Keep baths or showers shorter than 5 minutes and use warm water. Do not use hot water. This information is not intended to replace advice given to you by your health care provider. Make sure you discuss any questions you have with your health care provider. Document Released: 01/13/2000 Document Revised: 02/17/2016 Document Reviewed: 02/17/2016 Elsevier Interactive Patient Education  [employer].

## 2017-02-12 NOTE — Progress Notes (Signed)
Patient ID: Joshua Bentley, male    DOB: 1990-03-09, 27 y.o.   MRN: [id number]  PCP: Bing Neighbors, FNP  Chief Complaint  Patient presents with  . Establish Care  . Hospitalization Follow-up    Weight loss and body aches    Subjective:  HPI Joshua Bentley is a 27 y.o. male presents to establish care. Medical history significant for eczema and unintentional weight loss. He has received no routine primary care. He is a full time college student at [employer] he is currently taking the semester off due to stressful prior semester. During fall semester, he reports performing poorly academically. During this time, he loss his appetite and began to loose weight without trying in early October. Average weight in  Early 2018 was around 190 and at present he weighs 159. Admits to on-going depression and poor mood. 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  Since taking the semester out of school he feels less stressed. He complains today of ongoing chronic eczema. He has been treated by a dermatologist in the past with prednisone and steroidal creams. Eczema is chronically present, however with changes in weather, skin irritation and pruritis  worsens.  Social History   Socioeconomic History  . Marital status: Single    Spouse name: Not on file  . Number of children: Not on file  . Years of education: Not on file  . Highest education level: Not on file  Social Needs  . Financial resource strain: Not on file  . Food insecurity - worry: Not on file  . Food insecurity - inability: Not on file  . Transportation needs - medical: Not on file  . Transportation needs - non-medical: Not on file  Occupational History  . Not on file  Tobacco Use  . Smoking status: Never Smoker  . Smokeless tobacco: Never Used  Substance and Sexual Activity  . Alcohol use: Yes    Comment: social  . Drug use: No  . Sexual activity:  Not on file  Other Topics Concern  . Not on file  Social History Narrative  . Not on file    Family History  Problem Relation Age of Onset  . Cancer Maternal Grandmother      Review of Systems Constitutional: Negative for fever, chills, diaphoresis, activity change, appetite change and fatigue. HENT: Negative for ear pain, nosebleeds, congestion, facial swelling, rhinorrhea, neck pain, neck stiffness and ear discharge.  Eyes: Negative for pain, discharge, redness, itching and visual disturbance. Respiratory: Negative for cough, choking, chest tightness, shortness of breath, wheezing and stridor.  Cardiovascular: Negative for chest pain, palpitations and leg swelling. Gastrointestinal: Negative for abdominal distention. Genitourinary: Negative for dysuria, urgency, frequency, hematuria, flank pain, decreased urine volume, difficulty urinating and dyspareunia.  Musculoskeletal: Negative for back pain, joint swelling, arthralgia and gait problem. Integumentary:  Dry, itchy, skin arms, necks and back Neurological: Negative for dizziness, tremors, seizures, syncope, facial asymmetry, speech difficulty, weakness, light-headedness, numbness and headaches.  Hematological: Negative for adenopathy. Does not bruise/bleed easily. Psychiatric/Behavioral: Negative for hallucinations, behavioral problems, confusion, dysphoric mood, decreased concentration and agitation.    Prior to Admission medications   Medication Sig Start Date End Date Taking? Authorizing Provider  predniSONE (DELTASONE) 50 MG tablet Take 1 tablet daily with breakfast Patient not taking: Reported on 02/12/2017 02/04/17   Pisciotta, Mardella Layman    Past Medical, Surgical Family and Social History reviewed and updated.  Objective:   Today's Vitals   02/12/17 1426  BP: 124/80  Pulse: 80  Resp: 16  Temp: 98.1 F (36.7 C)  TempSrc: Oral  SpO2: 100%  Weight: 159 lb 6.4 oz (72.3 kg)  Height: 5\' 9"  (1.753 m)    Wt  Readings from Last 3 Encounters:  02/12/17 159 lb 6.4 oz (72.3 kg)  02/04/17 163 lb 8 oz (74.2 kg)    Physical Exam Constitutional: Patient appears well-developed and well-nourished. No distress. HENT: Normocephalic, atraumatic, External right and left ear normal. Oropharynx is clear and moist.  Eyes: Conjunctivae and EOM are normal. PERRLA, no scleral icterus. Neck: Normal ROM. Neck supple. No JVD. No tracheal deviation. No thyromegaly. CVS: RRR, S1/S2 +, no murmurs, no gallops, no carotid bruit.  Pulmonary: Effort and breath sounds normal, no stridor, rhonchi, wheezes, rales.  Abdominal: Soft. BS +, no distension, tenderness, rebound or guarding.  Musculoskeletal: Normal range of motion. No edema and no tenderness.  Lymphadenopathy: No lymphadenopathy noted, cervical, inguinal or axillary Neuro: Alert. Normal reflexes, muscle tone coordination. No cranial nerve deficit. Skin: Skin is warm, dry, macular, scaly lesions hands, bilateral arms and neck. Areas of excoriation noted.  Psychiatric: Normal mood and affect. Behavior, judgment, thought content normal.   Assessment & Plan:  1. Unintentional weight loss, unknown cause. Patient has experienced on-going depression and stressors related to college. Will check HIV- no known risks factors-no hx of IV drug use and or high risk sexual behaviors. Checking a hepatitis B panel and Thyroid functioning test.  2. Flexural eczema, chronic, unstable. Will administer 1 dose  methylPREDNISolone sodium succinate (SOLU-MEDROL) 125 mg/2 mL-Im. Start twice daily triamcinolone cream.  Tomorrow start prednisone taper times 9 days.  Patient should take as directed.  If no improvement return for care.  Patient given a [place] financial assistance application in order to receive a referral to dermatology once approved.  3. Screening for diabetes mellitus, A1C 5.1, normal. Repeat in 12 months.  4. Depression, unspecified depression type, at present he is not  interested in therapy as he does not have any insurance.  Will trial on escitalopram 10 mg daily at bedtime hopefully to facilitate rest, improve mood, and increase appetite as is a known side effect of SSRIs.  Patient will return in 6 weeks to evaluate effectiveness of treatment.   Meds ordered this encounter  Medications  . methylPREDNISolone sodium succinate (SOLU-MEDROL) 125 mg/2 mL injection 125 mg  . escitalopram (LEXAPRO) 10 MG tablet    Sig: Take 1 tablet (10 mg total) by mouth at bedtime.    Dispense:  60 tablet    Refill:  0    Order Specific Question:   Supervising Provider    Answer:   Quentin Angst [id number]  . predniSONE (DELTASONE) 20 MG tablet    Sig: Take 3 pills for 3 days, Take 2 pills for 3 days, and Take 1 pill for 3 days.    Dispense:  18 tablet    Refill:  0    Order Specific Question:   Supervising Provider    Answer:   Quentin Angst [id number]  . triamcinolone cream (KENALOG) 0.1 %    Sig: Apply 1 application topically 2 (two) times daily.    Dispense:  454 g    Refill:  0    Order Specific Question:   Supervising Provider    Answer:   Quentin Angst [id number]   Orders Placed This Encounter  Procedures  . HIV antibody  .  Thyroid Panel With TSH  . Hepatitis B surface antigen  . Hepatitis B surface antibody  . POCT glycosylated hemoglobin (Hb A1C)  . POCT urinalysis dip (device)   RTC: Return in 6 weeks for depression and eczema follow-up.   Godfrey PickKimberly S. Tiburcio PeaHarris, MSN, FNP-C [facility]  [address] [place], [place] [address] [phone]

## 2017-02-13 LAB — THYROID PANEL WITH TSH
Free Thyroxine Index: 1.6 (ref 1.2–4.9)
T3 Uptake Ratio: 23 % — ABNORMAL LOW (ref 24–39)
T4 TOTAL: 6.9 ug/dL (ref 4.5–12.0)
TSH: 0.938 u[IU]/mL (ref 0.450–4.500)

## 2017-02-13 LAB — HIV ANTIBODY (ROUTINE TESTING W REFLEX): HIV Screen 4th Generation wRfx: NONREACTIVE

## 2017-02-13 LAB — HEPATITIS B SURFACE ANTIBODY, QUANTITATIVE: Hepatitis B Surf Ab Quant: 4.1 m[IU]/mL — ABNORMAL LOW (ref >9.9)

## 2017-02-13 LAB — HEPATITIS B SURFACE ANTIGEN: Hepatitis B Surface Ag: NEGATIVE

## 2017-02-14 ENCOUNTER — Telehealth: Payer: Self-pay | Admitting: Family Medicine

## 2017-02-14 NOTE — Telephone Encounter (Signed)
Contact patient is advised that his labs were within expected parameters.  I do however recommend that he comes in to obtain a hep B series at his earliest convenience.  His labs indicate that he has never been vaccinated against hepatitis B infection.

## 2017-02-14 NOTE — Telephone Encounter (Signed)
Left a vm for patient to callback 

## 2017-02-19 NOTE — Telephone Encounter (Signed)
Patient notified and will come in to have Hep B series

## 2017-03-26 ENCOUNTER — Encounter: Payer: Self-pay | Admitting: Family Medicine

## 2017-03-26 ENCOUNTER — Ambulatory Visit (INDEPENDENT_AMBULATORY_CARE_PROVIDER_SITE_OTHER): Payer: Self-pay | Admitting: Family Medicine

## 2017-03-26 VITALS — BP 124/60 | HR 80 | Temp 98.4°F | Ht 69.0 in | Wt 174.0 lb

## 2017-03-26 DIAGNOSIS — R634 Abnormal weight loss: Secondary | ICD-10-CM

## 2017-03-26 DIAGNOSIS — L309 Dermatitis, unspecified: Secondary | ICD-10-CM

## 2017-03-26 DIAGNOSIS — L2082 Flexural eczema: Secondary | ICD-10-CM

## 2017-03-26 MED ORDER — PREDNISONE 10 MG PO TABS: 10.0000 mg | ORAL_TABLET | Freq: Every day | ORAL | 1 refills | Status: DC

## 2017-03-26 MED FILL — predniSONE 10 MG TABS: 10 | 30 days supply | Qty: 30 | Fill #0

## 2017-03-26 NOTE — Progress Notes (Signed)
Patient ID: Joshua Bentley, male    DOB: 01-29-1991, 27 y.o.   MRN: [id number]  PCP: Joshua Neighbors, FNP  Chief Complaint  Patient presents with  . Follow-up    6 weeks on depression, eczema    Subjective:  HPI Joshua Bentley is a 27 y.o. male chronic eczema and depression, presents for follow-up and medication management. Joshua Bentley was seen in office 6 weeks ago and treated with a prednisone taper and topical steroid treatment for chronic eczema.  During that visit he was also treated for depression with Lexapro 10 mg nightly.  He reports that he discontinued taking the Lexapro as he developed a lump on his hip. He reports that the lump resolved 10 days after stopping medication. He reports his overall mood has improved since his last visit without medication. He reports increased energy and more social interactive. He complains today of eczema returning after resolving for about 3-4 weeks after completion of prednisone. Hands are progressively beginning to itch and rash is recurring. He has no other complaints today.  Social History   Socioeconomic History  . Marital status: Single    Spouse name: Not on file  . Number of children: Not on file  . Years of education: Not on file  . Highest education level: Not on file  Social Needs  . Financial resource strain: Not on file  . Food insecurity - worry: Not on file  . Food insecurity - inability: Not on file  . Transportation needs - medical: Not on file  . Transportation needs - non-medical: Not on file  Occupational History  . Not on file  Tobacco Use  . Smoking status: Never Smoker  . Smokeless tobacco: Never Used  Substance and Sexual Activity  . Alcohol use: Yes    Comment: social  . Drug use: No  . Sexual activity: Not on file  Other Topics Concern  . Not on file  Social History Narrative  . Not on file    Family History  Problem Relation Age of Onset  . Cancer Maternal Grandmother     Review of Systems   Constitutional: Negative.   Respiratory: Negative.   Cardiovascular: Negative.   Skin: Positive for rash.  Psychiatric/Behavioral: Negative.    No Known Allergies  Prior to Admission medications   Medication Sig Start Date End Date Taking? Authorizing Provider  triamcinolone cream (KENALOG) 0.1 % Apply 1 application topically 2 (two) times daily. 02/12/17  Yes Joshua Neighbors, FNP  escitalopram (LEXAPRO) 10 MG tablet Take 1 tablet (10 mg total) by mouth at bedtime. Patient not taking: Reported on 03/26/2017 02/12/17   Joshua Neighbors, FNP  predniSONE (DELTASONE) 20 MG tablet Take 3 pills for 3 days, Take 2 pills for 3 days, and Take 1 pill for 3 days. Patient not taking: Reported on 03/26/2017 02/12/17   Joshua Neighbors, FNP    Past Medical, Surgical Family and Social History reviewed and updated.    Objective:   Today's Vitals   03/26/17 1419  Weight: 174 lb (78.9 kg)  Height: 5\' 9"  (1.753 m)    Wt Readings from Last 3 Encounters:  03/26/17 174 lb (78.9 kg)  02/12/17 159 lb 6.4 oz (72.3 kg)  02/04/17 163 lb 8 oz (74.2 kg)    Physical Exam  Constitutional: He is oriented to person, place, and time. He appears well-developed and well-nourished.  HENT:  Head: Normocephalic and atraumatic.  Neck: Normal range of motion. Neck supple.  Cardiovascular: Normal  rate, regular rhythm, normal heart sounds and intact distal pulses.  Pulmonary/Chest: Effort normal and breath sounds normal.  Musculoskeletal: Normal range of motion.  Lymphadenopathy:    He has no cervical adenopathy.  Neurological: He is alert and oriented to person, place, and time.  Skin: Skin is warm and dry. Rash noted.  Chronic dry patches of skin bilateral hands and lower extremity   Psychiatric: He has a normal mood and affect. His behavior is normal. Judgment and thought content normal.   Assessment & Plan:  1. Flexural eczema 2. Dermatitis Initially symptoms remitted with 9 day prednisone taper and  remained symptom free for 3-4 weeks. Dermatitis has returned. Patient is uninsured and doesn't have financial means to be pay for dermatology evaluation and treatment. Will resume low dose prednisone chronically in efforts to suppress symptoms from reoccurring.  Continue topical steroidal cream twice daily as prescribed.  3. Unintentional weight loss, resolved. Current Body mass index is 25.7 kg/m.   RTC: 2 months glucose and A1C check due to chronic prednisone regimen   Godfrey PickKimberly S. Tiburcio PeaHarris, MSN, FNP-C [facility]  [address] [place], [place] [address] [phone]

## 2017-04-17 ENCOUNTER — Telehealth: Payer: Self-pay

## 2017-04-17 ENCOUNTER — Telehealth: Payer: Self-pay | Admitting: Family Medicine

## 2017-04-17 ENCOUNTER — Ambulatory Visit (INDEPENDENT_AMBULATORY_CARE_PROVIDER_SITE_OTHER): Payer: Self-pay | Admitting: Family Medicine

## 2017-04-17 ENCOUNTER — Encounter: Payer: Self-pay | Admitting: Family Medicine

## 2017-04-17 VITALS — BP 130/76 | HR 84 | Temp 98.0°F | Ht 69.0 in | Wt 176.0 lb

## 2017-04-17 DIAGNOSIS — N6001 Solitary cyst of right breast: Secondary | ICD-10-CM

## 2017-04-17 NOTE — Progress Notes (Signed)
   Patient ID: Joshua Bentley, male    DOB: 02/09/1990, 27 y.o.   MRN: [id number]  PCP: Bing NeighborsHarris, Herberta Pickron S, FNP  Chief Complaint  Patient presents with  . Cyst    on right side of chest    Subjective:  HPI Joshua Bentley is a 27 y.o. male presents for evaluation of new lump of the right breast. Initially developed 3 weeks ago. The cyst is non-tender and non-erythema. The cyst is mobile and situated behind the right nipple and is mobile. He has no family history of breast cancer. This problem has not occurred in the past. Social History   Socioeconomic History  . Marital status: Single    Spouse name: Not on file  . Number of children: Not on file  . Years of education: Not on file  . Highest education level: Not on file  Social Needs  . Financial resource strain: Not on file  . Food insecurity - worry: Not on file  . Food insecurity - inability: Not on file  . Transportation needs - medical: Not on file  . Transportation needs - non-medical: Not on file  Occupational History  . Not on file  Tobacco Use  . Smoking status: Never Smoker  . Smokeless tobacco: Never Used  Substance and Sexual Activity  . Alcohol use: Yes    Comment: social  . Drug use: No  . Sexual activity: Not on file  Other Topics Concern  . Not on file  Social History Narrative  . Not on file    Family History  Problem Relation Age of Onset  . Cancer Maternal Grandmother    Review of Systems Pertinent negatives listed in HPI   No Known Allergies  Prior to Admission medications   Medication Sig Start Date End Date Taking? Authorizing Provider  predniSONE (DELTASONE) 10 MG tablet Take 1 tablet (10 mg total) by mouth daily with breakfast. 03/26/17  Yes Bing NeighborsHarris, Olivea Sonnen S, FNP  triamcinolone cream (KENALOG) 0.1 % Apply 1 application topically 2 (two) times daily. 02/12/17  Yes Bing NeighborsHarris, Evana Runnels S, FNP    Past Medical, Surgical Family and Social History reviewed and updated.    Objective:    Today's Vitals   04/17/17 1523  BP: 130/76  Pulse: 84  Temp: 98 F (36.7 C)  TempSrc: Oral  SpO2: 99%  Weight: 176 lb (79.8 kg)  Height: 5\' 9"  (1.753 m)    Wt Readings from Last 3 Encounters:  04/17/17 176 lb (79.8 kg)  03/26/17 174 lb (78.9 kg)  02/12/17 159 lb 6.4 oz (72.3 kg)   Physical Exam  Constitutional: He appears well-developed and well-nourished.  Cardiovascular: Normal rate, regular rhythm, normal heart sounds and intact distal pulses.  Pulmonary/Chest: Effort normal and breath sounds normal. Right breast exhibits mass. Right breast exhibits no inverted nipple, no nipple discharge, no skin change and no tenderness.      Assessment & Plan:  1. Breast cyst, right, newly appearing. Recommend further evaluation with a diagnostic mammogram. Low risk for malignancy-no family history of breast cancer, however additional testing warranted. Ordered placed for right diagnostic mammogram.  RTC: As needed   Godfrey PickKimberly S. Tiburcio PeaHarris, MSN, FNP-C [facility]  [address] [place], [place] [address] [phone]

## 2017-04-17 NOTE — Telephone Encounter (Signed)
We are unable to see this patient in the [facility] program. We are able to see patients that are females or transgender. Hope this information helps.  Thanks, Martie LeeSabrina

## 2017-04-17 NOTE — Telephone Encounter (Signed)
Please inquire with the [facility] if patient qualifies for a dx left breast ultrasound. He has new onset mobile nodule of the left breast behind areolas. If not, please schedule ultrasound at [facility]. Patient will be cash pay.    Godfrey PickKimberly S. Tiburcio PeaHarris, MSN, FNP-C [facility]  [address] [place], [place] [address] [phone]

## 2017-04-17 NOTE — Telephone Encounter (Signed)
Can this patient be qualified for [facility] for a mass on his right chest area

## 2017-04-17 NOTE — Patient Instructions (Signed)
Fibroadenoma A fibroadenoma is a lump (tumor) in the breast. The lump is not cancer (is benign). It may move under your skin when you touch it. This kind of lump can grow in one breast or in both breasts. Follow these instructions at home:  If you had a lump removed, follow instructions from your doctor for home care after the procedure.  Check your breasts at home as told by your doctor.  Keep all follow-up visits as told by your doctor. This is important. Contact a doctor if:  The lump changes in size or feels different.  The lump starts to be painful.  You find a new lump.  You have any changes in the skin that covers your breast.  You have any changes in your nipple.  You have fluid leaking from your nipple. This information is not intended to replace advice given to you by your health care provider. Make sure you discuss any questions you have with your health care provider. Document Released: 04/13/2008 Document Revised: 06/23/2015 Document Reviewed: 01/06/2014 Elsevier Interactive Patient Education  2018 [employer]vier Inc[employer]  

## 2017-04-18 ENCOUNTER — Other Ambulatory Visit: Payer: Self-pay | Admitting: Family Medicine

## 2017-04-18 DIAGNOSIS — N6001 Solitary cyst of right breast: Secondary | ICD-10-CM

## 2017-04-18 NOTE — Telephone Encounter (Signed)
Patient notified

## 2017-04-18 NOTE — Telephone Encounter (Signed)
[facility] schedule at [facility] for [facility] Monday at 1230. Left vm for patient to callback

## 2017-04-22 ENCOUNTER — Other Ambulatory Visit: Payer: Self-pay

## 2017-04-26 ENCOUNTER — Ambulatory Visit
Admission: RE | Admit: 2017-04-26 | Discharge: 2017-04-26 | Disposition: A | Discharge status: Home / Self Care | Payer: Self-pay | Source: Ambulatory Visit | Attending: Family Medicine | Admitting: Family Medicine

## 2017-04-26 DIAGNOSIS — N6001 Solitary cyst of right breast: Secondary | ICD-10-CM

## 2017-05-22 ENCOUNTER — Ambulatory Visit: Payer: Self-pay | Admitting: Family Medicine

## 2017-06-06 ENCOUNTER — Ambulatory Visit: Payer: Self-pay | Admitting: Family Medicine

## 2017-10-25 ENCOUNTER — Encounter: Payer: Self-pay | Admitting: Family Medicine

## 2017-10-25 ENCOUNTER — Ambulatory Visit (INDEPENDENT_AMBULATORY_CARE_PROVIDER_SITE_OTHER): Payer: Self-pay | Admitting: Family Medicine

## 2017-10-25 VITALS — BP 140/72 | HR 90 | Temp 98.8°F | Ht 69.0 in | Wt 180.2 lb

## 2017-10-25 DIAGNOSIS — N390 Urinary tract infection, site not specified: Secondary | ICD-10-CM

## 2017-10-25 DIAGNOSIS — Z09 Encounter for follow-up examination after completed treatment for conditions other than malignant neoplasm: Secondary | ICD-10-CM

## 2017-10-25 DIAGNOSIS — L309 Dermatitis, unspecified: Secondary | ICD-10-CM

## 2017-10-25 DIAGNOSIS — G4700 Insomnia, unspecified: Secondary | ICD-10-CM

## 2017-10-25 DIAGNOSIS — N6452 Nipple discharge: Secondary | ICD-10-CM

## 2017-10-25 DIAGNOSIS — Z131 Encounter for screening for diabetes mellitus: Secondary | ICD-10-CM

## 2017-10-25 DIAGNOSIS — L299 Pruritus, unspecified: Secondary | ICD-10-CM

## 2017-10-25 DIAGNOSIS — R8290 Unspecified abnormal findings in urine: Secondary | ICD-10-CM

## 2017-10-25 DIAGNOSIS — N6001 Solitary cyst of right breast: Secondary | ICD-10-CM

## 2017-10-25 LAB — POCT URINALYSIS DIP (MANUAL ENTRY)
Bilirubin, UA: NEGATIVE
Blood, UA: NEGATIVE
Glucose, UA: NEGATIVE mg/dL
Nitrite, UA: POSITIVE — AB
Protein Ur, POC: NEGATIVE mg/dL
Spec Grav, UA: 1.015 (ref 1.010–1.025)
Urobilinogen, UA: 1 E.U./dL
pH, UA: 6.5 (ref 5.0–8.0)

## 2017-10-25 LAB — POCT GLYCOSYLATED HEMOGLOBIN (HGB A1C): Hemoglobin A1C: 5 % (ref 4.0–5.6)

## 2017-10-25 MED ORDER — TRIAMCINOLONE ACETONIDE 0.1 % EX OINT: TOPICAL_OINTMENT | CUTANEOUS | 0 refills | Status: DC

## 2017-10-25 MED ORDER — PREDNISONE 10 MG PO TABS: 10.0000 mg | ORAL_TABLET | Freq: Every day | ORAL | 1 refills | Status: AC

## 2017-10-25 MED ORDER — TRAZODONE HCL 50 MG PO TABS: 50.0000 mg | ORAL_TABLET | Freq: Every evening | ORAL | 3 refills | Status: DC | PRN

## 2017-10-25 MED ORDER — SULFAMETHOXAZOLE-TRIMETHOPRIM 800-160 MG PO TABS: 1.0000 | ORAL_TABLET | Freq: Two times a day (BID) | ORAL | 0 refills | Status: DC

## 2017-10-25 MED ORDER — HYDROXYZINE HCL 10 MG PO TABS: 10.0000 mg | ORAL_TABLET | Freq: Three times a day (TID) | ORAL | 3 refills | Status: AC | PRN

## 2017-10-25 MED ORDER — TRIAMCINOLONE ACETONIDE 0.1 % EX CREA: 1.0000 "application " | TOPICAL_CREAM | Freq: Two times a day (BID) | CUTANEOUS | 3 refills | Status: DC

## 2017-10-25 NOTE — Progress Notes (Signed)
Follow Up  Subjective:    Patient ID: Joshua Bentley, male    DOB: 06-09-1990, 27 y.o.   MRN: [id number]  Chief Complaint  Patient presents with  . Eczema  . Breast Problem    left one leaking and larger than right   HPI Mr. Castilla is a 27 year old male with a past medical history of Eczema and Breast Cysts. He is here today for follow up.   Current Status: Since his last office visit, he states that he continues to feel cyst on right breast, but he is beginning to having breast discharge from his left breast for about 1 month now. He denies pain, but has discomfort and concern about leakage. He has chronic Eczema over his entire body, which is thickest on his abdomen/flank area, knee, hands, and ankles. Backs of his ankles and feet have some areas of very dry, open skin. He was seeing   He denies fevers, chills, fatigue, recent infections, weight loss, and night sweats. He has not had any headaches, visual changes, dizziness, and falls. No chest pain, heart palpitations, cough and shortness of breath reported. No reports of GI problems such as nausea, vomiting, diarrhea, and constipation. He has no reports of blood in stools, dysuria and hematuria. No depression or anxiety reported. He denies pain today.   Past Medical History:  Diagnosis Date  . Eczema     Family History  Problem Relation Age of Onset  . Cancer Maternal Grandmother   . Breast cancer Maternal Grandmother     Social History   Socioeconomic History  . Marital status: Single    Spouse name: Not on file  . Number of children: Not on file  . Years of education: Not on file  . Highest education level: Not on file  Occupational History  . Not on file  Social Needs  . Financial resource strain: Not on file  . Food insecurity:    Worry: Not on file    Inability: Not on file  . Transportation needs:    Medical: Not on file    Non-medical: Not on file  Tobacco Use  . Smoking status: Never Smoker  . Smokeless  tobacco: Never Used  Substance and Sexual Activity  . Alcohol use: Yes    Comment: social  . Drug use: No  . Sexual activity: Not on file  Lifestyle  . Physical activity:    Days per week: Not on file    Minutes per session: Not on file  . Stress: Not on file  Relationships  . Social connections:    Talks on phone: Not on file    Gets together: Not on file    Attends religious service: Not on file    Active member of club or organization: Not on file    Attends meetings of clubs or organizations: Not on file    Relationship status: Not on file  . Intimate partner violence:    Fear of current or ex partner: Not on file    Emotionally abused: Not on file    Physically abused: Not on file    Forced sexual activity: Not on file  Other Topics Concern  . Not on file  Social History Narrative  . Not on file    Past Surgical History:  Procedure Laterality Date  . ESOPHAGOGASTRODUODENOSCOPY N/A 07/08/2016   Procedure: ESOPHAGOGASTRODUODENOSCOPY (EGD);  Surgeon: Kathi Der, MD;  Location: [facility];  Service: Gastroenterology;  Laterality: N/A;     There  is no immunization history on file for this patient.  No outpatient medications have been marked as taking for the 10/25/17 encounter (Office Visit) with Kallie Locks, FNP.    No Known Allergies  BP 140/72   Pulse 90   Temp 98.8 F (37.1 C)   Ht 5\' 9"  (1.753 m)   Wt 180 lb 3.2 oz (81.7 kg)   BMI 26.61 kg/m   Review of Systems  Constitutional: Negative.   HENT: Negative.   Eyes: Negative.   Respiratory: Negative.   Cardiovascular: Negative.   Gastrointestinal: Negative.   Endocrine: Negative.   Genitourinary: Negative.   Musculoskeletal: Negative.   Skin: Positive for rash (r/t: Eczema over entire body).  Allergic/Immunologic: Negative.   Neurological: Negative.   Hematological: Negative.   Psychiatric/Behavioral: Negative.    Objective:   Physical Exam  Constitutional: He is oriented to  person, place, and time. He appears well-developed and well-nourished.  HENT:  Head: Normocephalic and atraumatic.  Right Ear: External ear normal.  Left Ear: External ear normal.  Nose: Nose normal.  Mouth/Throat: Oropharynx is clear and moist.  Eyes: Pupils are equal, round, and reactive to light. Conjunctivae and EOM are normal.  Neck: Normal range of motion. Neck supple.  Cardiovascular: Normal rate, regular rhythm, normal heart sounds and intact distal pulses.  Pulmonary/Chest: Effort normal and breath sounds normal.  Abdominal: Soft. Bowel sounds are normal.  Musculoskeletal: Normal range of motion.  Neurological: He is alert and oriented to person, place, and time.  Skin: Skin is warm and dry. Capillary refill takes less than 2 seconds. Rash (Eczema over entire body.) noted.     Psychiatric: He has a normal mood and affect. His behavior is normal. Judgment and thought content normal.  Nursing note and vitals reviewed.  Assessment & Plan:   1. Screening for diabetes mellitus Hgb A1c is normal at 5.0 today. He will continue to decrease foods/beverages high in sugars and carbs and follow Heart Healthy or DASH diet. Increase physical activity to at least 30 minutes cardio exercise daily.  - POCT urinalysis dipstick - POCT glycosylated hemoglobin (Hb A1C)  2. Abnormal urine Rx for Septra to pharmacy today.   3. Eczema, unspecified type We will give him steroid to help minimize inflammation.  - predniSONE (DELTASONE) 10 MG tablet; Take 1 tablet (10 mg total) by mouth daily with breakfast.  Dispense: 30 tablet; Refill: 1 - triamcinolone cream (KENALOG) 0.1 %; Apply 1 application topically 2 (two) times daily.  Dispense: 45 g; Refill: 3 - triamcinolone ointment (KENALOG) 0.1 %; Apply 2 times daily to feet.  Dispense: 30 g; Refill: 0  4. Itching Rx for Triamcinolone Ointment to pharmacy today for dry, cracking, skin eczema on feet and toes. Atarax for intense itching.  -  hydrOXYzine (ATARAX/VISTARIL) 10 MG tablet; Take 1 tablet (10 mg total) by mouth 3 (three) times daily as needed for itching.  Dispense: 30 tablet; Refill: 3 - triamcinolone ointment (KENALOG) 0.1 %; Apply 2 times daily to feet.  Dispense: 30 g; Refill: 0  5. Insomnia, unspecified type We will initiate Trazodone today to aide in sleep.  - traZODone (DESYREL) 50 MG tablet; Take 1 tablet (50 mg total) by mouth at bedtime as needed for sleep.  Dispense: 30 tablet; Refill: 3  6. Urinary tract infection without hematuria, site unspecifiedd - sulfamethoxazole-trimethoprim (BACTRIM DS,SEPTRA DS) 800-160 MG tablet; Take 1 tablet by mouth 2 (two) times daily.  Dispense: 14 tablet; Refill: 0  7. Benign cyst of  breast, right Stable. Bilateral Mammogram on 04/26/2017 revealed  Benign Gynecomastia. We will continue to monitor.   8. Breast discharge Probable r/t Benign Gynecomastia.   9. Abnormal urinalysis - Urine Culture  10. Follow up Follow up in 1 month.   Meds ordered this encounter  Medications  . predniSONE (DELTASONE) 10 MG tablet    Sig: Take 1 tablet (10 mg total) by mouth daily with breakfast.    Dispense:  30 tablet    Refill:  1  . triamcinolone cream (KENALOG) 0.1 %    Sig: Apply 1 application topically 2 (two) times daily.    Dispense:  45 g    Refill:  3  . hydrOXYzine (ATARAX/VISTARIL) 10 MG tablet    Sig: Take 1 tablet (10 mg total) by mouth 3 (three) times daily as needed for itching.    Dispense:  30 tablet    Refill:  3  . traZODone (DESYREL) 50 MG tablet    Sig: Take 1 tablet (50 mg total) by mouth at bedtime as needed for sleep.    Dispense:  30 tablet    Refill:  3  . sulfamethoxazole-trimethoprim (BACTRIM DS,SEPTRA DS) 800-160 MG tablet    Sig: Take 1 tablet by mouth 2 (two) times daily.    Dispense:  14 tablet    Refill:  0  . triamcinolone ointment (KENALOG) 0.1 %    Sig: Apply 2 times daily to feet.    Dispense:  30 g    Refill:  0    Raliegh Ip,   MSN, FNP-C Patient [facility] [address] [place], [place] [address] [phone]

## 2017-10-25 NOTE — Patient Instructions (Addendum)
Nystatin; Triamcinolone cream or ointment What is this medicine? NYSTATIN; TRIAMCINOLONE (nye STAT in; trye am SIN oh lone) is a combination of an antifungal medicine and a steroid. It is used to treat certain kinds of fungal or yeast infections of the skin. This medicine may be used for other purposes; ask your health care provider or pharmacist if you have questions. COMMON BRAND NAME(S): Myco-Triacet-II, Mycogen-II, Mycolog II, Mytrex, N.T.A. What should I tell my health care provider before I take this medicine? They need to know if you have any of these conditions: -large areas of burned or damaged skin -skin wasting or thinning -peripheral vascular disease or poor circulation -an unusual or allergic reaction to nystatin, triamcinolone, other corticosteroids, other medicines, foods, dyes, or preservatives -pregnant or trying to get pregnant -breast-feeding How should I use this medicine? This medicine is for external use only. Do not take by mouth. Follow the directions on the prescription label. Wash your hands before and after use. If treating hand or nail infections, wash hands before use only. Apply a thin layer of this medicine to the affected area and rub in gently. Do not use on healthy skin or over large areas of skin. Do not get this medicine in your eyes. If you do, rinse out with plenty of cool tap water. When applying to the groin area, apply a limited amount and do not use for longer than 2 weeks unless directed to by your doctor or health care professional. Do not cover or wrap the treated area with an airtight bandage (such as a plastic bandage). Use the full course of treatment prescribed, even if you think the infection is getting better. Use at regular intervals. Do not use your medicine more often than directed. Do not use this medicine for any condition other than the one for which it was prescribed. Talk to your pediatrician regarding the use of this medicine in children.  While this drug may be prescribed for selected conditions, precautions do apply. Children being treated in the diaper area should not wear tight-fitting diapers or plastic pants. Elderly patients are more likely to have damaged skin through aging, and this may increase side effects. This medicine should only be used for brief periods and infrequently in older patients. Overdosage: If you think you have taken too much of this medicine contact a poison control center or emergency room at once. NOTE: This medicine is only for you. Do not share this medicine with others. What if I miss a dose? If you miss a dose, use it as soon as you can. If it is almost time for your next dose, use only that dose. Do not use double or extra doses. What may interact with this medicine? Interactions are not expected. Do not use any other skin products on the affected area without telling your doctor or health care professional. This list may not describe all possible interactions. Give your health care provider a list of all the medicines, herbs, non-prescription drugs, or dietary supplements you use. Also tell them if you smoke, drink alcohol, or use illegal drugs. Some items may interact with your medicine. What should I watch for while using this medicine? Tell your doctor or health care professional if your symptoms do not start to get better within 1 week when treating the groin area or within 2 weeks when treating the feet. . Tell your doctor or health care professional if you develop sores or blisters that do not heal properly. If your skin  infection returns after stopping this medicine, contact your doctor or health care professional. If you are using this medicine to treat an infection in the groin area, do not wear underwear that is tight-fitting or made from synthetic fibers such as rayon or nylon. Instead, wear loose-fitting, cotton underwear. Also dry the area completely after bathing. What side effects may I  notice from receiving this medicine? Side effects that you should report to your doctor or health care professional as soon as possible: -burning or itching of the skin -dark red spots on the skin -loss of feeling on skin -painful, red, pus-filled blisters in hair follicles -skin infection -thinning of the skin or sunburn: more likely if applied to the face Side effects that usually do not require medical attention (report to your doctor or health care professional if they continue or are bothersome): -dry or peeling skin -skin irritation This list may not describe all possible side effects. Call your doctor for medical advice about side effects. You may report side effects to FDA at 1-800-FDA-1088. Where should I keep my medicine? Keep out of the reach of children. Store at room temperature between 15 and 30 degrees C (59 and 86 degrees F). Do not freeze. Throw away any unused medicine after the expiration date. NOTE: This sheet is a summary. It may not cover all possible information. If you have questions about this medicine, talk to your doctor, pharmacist, or health care provider.  2018 Elsevier/Gold Standard (2007-08-08 17:29:26) Hydroxyzine capsules or tablets What is this medicine? HYDROXYZINE (hye DROX i zeen) is an antihistamine. This medicine is used to treat allergy symptoms. It is also used to treat anxiety and tension. This medicine can be used with other medicines to induce sleep before surgery. This medicine may be used for other purposes; ask your health care provider or pharmacist if you have questions. COMMON BRAND NAME(S): ANX, Atarax, Rezine, Vistaril What should I tell my health care provider before I take this medicine? They need to know if you have any of these conditions: -any chronic illness -difficulty passing urine -glaucoma -heart disease -kidney disease -liver disease -lung disease -an unusual or allergic reaction to hydroxyzine, cetirizine, other  medicines, foods, dyes, or preservatives -pregnant or trying to get pregnant -breast-feeding How should I use this medicine? Take this medicine by mouth with a full glass of water. Follow the directions on the prescription label. You may take this medicine with food or on an empty stomach. Take your medicine at regular intervals. Do not take your medicine more often than directed. Talk to your pediatrician regarding the use of this medicine in children. Special care may be needed. While this drug may be prescribed for children as young as 50 years of age for selected conditions, precautions do apply. Patients over 47 years old may have a stronger reaction and need a smaller dose. Overdosage: If you think you have taken too much of this medicine contact a poison control center or emergency room at once. NOTE: This medicine is only for you. Do not share this medicine with others. What if I miss a dose? If you miss a dose, take it as soon as you can. If it is almost time for your next dose, take only that dose. Do not take double or extra doses. What may interact with this medicine? -alcohol -barbiturate medicines for sleep or seizures -medicines for colds, allergies -medicines for depression, anxiety, or emotional disturbances -medicines for pain -medicines for sleep -muscle relaxants This list may  not describe all possible interactions. Give your health care provider a list of all the medicines, herbs, non-prescription drugs, or dietary supplements you use. Also tell them if you smoke, drink alcohol, or use illegal drugs. Some items may interact with your medicine. What should I watch for while using this medicine? Tell your doctor or health care professional if your symptoms do not improve. You may get drowsy or dizzy. Do not drive, use machinery, or do anything that needs mental alertness until you know how this medicine affects you. Do not stand or sit up quickly, especially if you are an  older patient. This reduces the risk of dizzy or fainting spells. Alcohol may interfere with the effect of this medicine. Avoid alcoholic drinks. Your mouth may get dry. Chewing sugarless gum or sucking hard candy, and drinking plenty of water may help. Contact your doctor if the problem does not go away or is severe. This medicine may cause dry eyes and blurred vision. If you wear contact lenses you may feel some discomfort. Lubricating drops may help. See your eye doctor if the problem does not go away or is severe. If you are receiving skin tests for allergies, tell your doctor you are using this medicine. What side effects may I notice from receiving this medicine? Side effects that you should report to your doctor or health care professional as soon as possible: -fast or irregular heartbeat -difficulty passing urine -seizures -slurred speech or confusion -tremor Side effects that usually do not require medical attention (report to your doctor or health care professional if they continue or are bothersome): -constipation -drowsiness -fatigue -headache -stomach upset This list may not describe all possible side effects. Call your doctor for medical advice about side effects. You may report side effects to FDA at 1-800-FDA-1088. Where should I keep my medicine? Keep out of the reach of children. Store at room temperature between 15 and 30 degrees C (59 and 86 degrees F). Keep container tightly closed. Throw away any unused medicine after the expiration date. NOTE: This sheet is a summary. It may not cover all possible information. If you have questions about this medicine, talk to your doctor, pharmacist, or health care provider.  2018 Elsevier/Gold Standard (2007-05-30 14:50:59)      Trazodone tablets What is this medicine? TRAZODONE (TRAZ oh done) is used to treat depression. This medicine may be used for other purposes; ask your health care provider or pharmacist if you have  questions. COMMON BRAND NAME(S): Desyrel What should I tell my health care provider before I take this medicine? They need to know if you have any of these conditions: -attempted suicide or thinking about it -bipolar disorder -bleeding problems -glaucoma -heart disease, or previous heart attack -irregular heart beat -kidney or liver disease -low levels of sodium in the blood -an unusual or allergic reaction to trazodone, other medicines, foods, dyes or preservatives -pregnant or trying to get pregnant -breast-feeding How should I use this medicine? Take this medicine by mouth with a glass of water. Follow the directions on the prescription label. Take this medicine shortly after a meal or a light snack. Take your medicine at regular intervals. Do not take your medicine more often than directed. Do not stop taking this medicine suddenly except upon the advice of your doctor. Stopping this medicine too quickly may cause serious side effects or your condition may worsen. A special MedGuide will be given to you by the pharmacist with each prescription and refill. Be sure to  read this information carefully each time. Talk to your pediatrician regarding the use of this medicine in children. Special care may be needed. Overdosage: If you think you have taken too much of this medicine contact a poison control center or emergency room at once. NOTE: This medicine is only for you. Do not share this medicine with others. What if I miss a dose? If you miss a dose, take it as soon as you can. If it is almost time for your next dose, take only that dose. Do not take double or extra doses. What may interact with this medicine? Do not take this medicine with any of the following medications: -certain medicines for fungal infections like fluconazole, itraconazole, ketoconazole, posaconazole, voriconazole -cisapride -dofetilide -dronedarone -linezolid -MAOIs like Carbex, Eldepryl, Marplan, Nardil, and  Parnate -mesoridazine -methylene blue (injected into a vein) -pimozide -saquinavir -thioridazine -ziprasidone This medicine may also interact with the following medications: -alcohol -antiviral medicines for HIV or AIDS -aspirin and aspirin-like medicines -barbiturates like phenobarbital -certain medicines for blood pressure, heart disease, irregular heart beat -certain medicines for depression, anxiety, or psychotic disturbances -certain medicines for migraine headache like almotriptan, eletriptan, frovatriptan, naratriptan, rizatriptan, sumatriptan, zolmitriptan -certain medicines for seizures like carbamazepine and phenytoin -certain medicines for sleep -certain medicines that treat or prevent blood clots like dalteparin, enoxaparin, warfarin -digoxin -fentanyl -lithium -NSAIDS, medicines for pain and inflammation, like ibuprofen or naproxen -other medicines that prolong the QT interval (cause an abnormal heart rhythm) -rasagiline -supplements like St. John's wort, kava kava, valerian -tramadol -tryptophan This list may not describe all possible interactions. Give your health care provider a list of all the medicines, herbs, non-prescription drugs, or dietary supplements you use. Also tell them if you smoke, drink alcohol, or use illegal drugs. Some items may interact with your medicine. What should I watch for while using this medicine? Tell your doctor if your symptoms do not get better or if they get worse. Visit your doctor or health care professional for regular checks on your progress. Because it may take several weeks to see the full effects of this medicine, it is important to continue your treatment as prescribed by your doctor. Patients and their families should watch out for new or worsening thoughts of suicide or depression. Also watch out for sudden changes in feelings such as feeling anxious, agitated, panicky, irritable, hostile, aggressive, impulsive, severely  restless, overly excited and hyperactive, or not being able to sleep. If this happens, especially at the beginning of treatment or after a change in dose, call your health care professional. Bonita Quin may get drowsy or dizzy. Do not drive, use machinery, or do anything that needs mental alertness until you know how this medicine affects you. Do not stand or sit up quickly, especially if you are an older patient. This reduces the risk of dizzy or fainting spells. Alcohol may interfere with the effect of this medicine. Avoid alcoholic drinks. This medicine may cause dry eyes and blurred vision. If you wear contact lenses you may feel some discomfort. Lubricating drops may help. See your eye doctor if the problem does not go away or is severe. Your mouth may get dry. Chewing sugarless gum, sucking hard candy and drinking plenty of water may help. Contact your doctor if the problem does not go away or is severe. What side effects may I notice from receiving this medicine? Side effects that you should report to your doctor or health care professional as soon as possible: -allergic reactions like skin rash,  itching or hives, swelling of the face, lips, or tongue -elevated mood, decreased need for sleep, racing thoughts, impulsive behavior -confusion -fast, irregular heartbeat -feeling faint or lightheaded, falls -feeling agitated, angry, or irritable -loss of balance or coordination -painful or prolonged erections -restlessness, pacing, inability to keep still -suicidal thoughts or other mood changes -tremors -trouble sleeping -seizures -unusual bleeding or bruising Side effects that usually do not require medical attention (report to your doctor or health care professional if they continue or are bothersome): -change in sex drive or performance -change in appetite or weight -constipation -headache -muscle aches or pains -nausea This list may not describe all possible side effects. Call your doctor  for medical advice about side effects. You may report side effects to FDA at 1-800-FDA-1088. Where should I keep my medicine? Keep out of the reach of children. Store at room temperature between 15 and 30 degrees C (59 to 86 degrees F). Protect from light. Keep container tightly closed. Throw away any unused medicine after the expiration date. NOTE: This sheet is a summary. It may not cover all possible information. If you have questions about this medicine, talk to your doctor, pharmacist, or health care provider.  2018 Elsevier/Gold Standard (2015-06-16 16:57:05)

## 2017-10-27 LAB — URINE CULTURE

## 2017-11-29 ENCOUNTER — Ambulatory Visit: Payer: Self-pay | Admitting: Family Medicine

## 2018-02-28 ENCOUNTER — Ambulatory Visit: Payer: Self-pay | Admitting: Family Medicine

## 2018-03-14 ENCOUNTER — Ambulatory Visit (INDEPENDENT_AMBULATORY_CARE_PROVIDER_SITE_OTHER): Payer: BLUE CROSS/BLUE SHIELD | Admitting: Family Medicine

## 2018-03-14 ENCOUNTER — Encounter: Payer: Self-pay | Admitting: Family Medicine

## 2018-03-14 VITALS — BP 120/70 | HR 92 | Temp 98.3°F | Ht 69.0 in | Wt 173.0 lb

## 2018-03-14 DIAGNOSIS — Z7689 Persons encountering health services in other specified circumstances: Secondary | ICD-10-CM

## 2018-03-14 DIAGNOSIS — R634 Abnormal weight loss: Secondary | ICD-10-CM | POA: Diagnosis not present

## 2018-03-14 DIAGNOSIS — R0602 Shortness of breath: Secondary | ICD-10-CM | POA: Diagnosis not present

## 2018-03-14 DIAGNOSIS — L309 Dermatitis, unspecified: Secondary | ICD-10-CM

## 2018-03-14 LAB — COMPREHENSIVE METABOLIC PANEL
ALBUMIN: 4.3 g/dL (ref 3.5–5.2)
ALT: 14 U/L (ref 0–53)
AST: 21 U/L (ref 0–37)
Alkaline Phosphatase: 58 U/L (ref 39–117)
BUN: 8 mg/dL (ref 6–23)
CALCIUM: 9.5 mg/dL (ref 8.4–10.5)
CO2: 30 mEq/L (ref 19–32)
Chloride: 104 mEq/L (ref 96–112)
Creatinine, Ser: 0.77 mg/dL (ref 0.40–1.50)
GFR: 146.38 mL/min (ref >60.00)
GLUCOSE: 63 mg/dL — AB (ref 70–99)
POTASSIUM: 4.6 meq/L (ref 3.5–5.1)
Sodium: 141 mEq/L (ref 135–145)
TOTAL PROTEIN: 6.9 g/dL (ref 6.0–8.3)
Total Bilirubin: 0.7 mg/dL (ref 0.2–1.2)

## 2018-03-14 LAB — CBC
HEMATOCRIT: 44.6 % (ref 39.0–52.0)
Hemoglobin: 14.8 g/dL (ref 13.0–17.0)
MCHC: 33.2 g/dL (ref 30.0–36.0)
MCV: 84.6 fl (ref 78.0–100.0)
Platelets: 308 10*3/uL (ref 150.0–400.0)
RBC: 5.27 Mil/uL (ref 4.22–5.81)
RDW: 13.2 % (ref 11.5–15.5)
WBC: 7.3 10*3/uL (ref 4.0–10.5)

## 2018-03-14 LAB — T4, FREE: FREE T4: 0.8 ng/dL (ref 0.60–1.60)

## 2018-03-14 LAB — HEMOGLOBIN A1C: Hgb A1c MFr Bld: 4.7 % (ref 4.6–6.5)

## 2018-03-14 LAB — TSH: TSH: 0.8 u[IU]/mL (ref 0.35–4.50)

## 2018-03-14 NOTE — Progress Notes (Signed)
Patient presents to clinic today to f/u on ongoing concerns and establish care.  SUBJECTIVE: PMH: Pt is a 28 yo male with pmh sig for eczema.    Eczema: -severe -dealing with for several yrs -endorses thick, dry, itchy skin all over.   -prednisone taper helped some.  Also used hydroxyzine 25 mg 3 times daily as needed -Now becoming painful on abdomen esp when getting in and out of car -may take ibuprofen.  Pain 6/10 on prednisone.  8/10 without  -seen by several Dermatologist in the past who were largely unhelpful.  [facility] in [place] -skin better when weather is warmer  -using Jergens lotion, baby oil gel.  Ongoing concerns: -pt mentions SOB, wt loss, and decreased muscle mass -x 6 months feels like "can't take a full breath" -denies wheezing, CP, palpitations -mostly at rest -7 lb wt loss since sept 2019 without trying  Allergies: NKDA  Past surgical history: None  Social history: Patient is single.  Pt is from [place], [place].  Pt took time away from school.  He hopes to finish his degree at [employer].  Pt currently works as a Museum/gallery conservator.  Patient endorses alcohol, tobacco, occasional MJ use.  Past Medical History:  Diagnosis Date  . Eczema     Past Surgical History:  Procedure Laterality Date  . ESOPHAGOGASTRODUODENOSCOPY N/A 07/08/2016   Procedure: ESOPHAGOGASTRODUODENOSCOPY (EGD);  Surgeon: Kathi Der, MD;  Location: [facility];  Service: Gastroenterology;  Laterality: N/A;    Current Outpatient Medications on File Prior to Visit  Medication Sig Dispense Refill  . hydrOXYzine (ATARAX/VISTARIL) 10 MG tablet Take 1 tablet (10 mg total) by mouth 3 (three) times daily as needed for itching. 30 tablet 3  . predniSONE (DELTASONE) 10 MG tablet Take 1 tablet (10 mg total) by mouth daily with breakfast. 30 tablet 1   No current facility-administered medications on file prior to visit.     No Known Allergies  Family History  Problem Relation Age of  Onset  . Cancer Maternal Grandmother   . Breast cancer Maternal Grandmother     Social History   Socioeconomic History  . Marital status: Single    Spouse name: Not on file  . Number of children: Not on file  . Years of education: Not on file  . Highest education level: Not on file  Occupational History  . Not on file  Social Needs  . Financial resource strain: Not on file  . Food insecurity:    Worry: Not on file    Inability: Not on file  . Transportation needs:    Medical: Not on file    Non-medical: Not on file  Tobacco Use  . Smoking status: Never Smoker  . Smokeless tobacco: Never Used  Substance and Sexual Activity  . Alcohol use: Yes    Comment: social  . Drug use: No  . Sexual activity: Yes  Lifestyle  . Physical activity:    Days per week: Not on file    Minutes per session: Not on file  . Stress: Not on file  Relationships  . Social connections:    Talks on phone: Not on file    Gets together: Not on file    Attends religious service: Not on file    Active member of club or organization: Not on file    Attends meetings of clubs or organizations: Not on file    Relationship status: Not on file  . Intimate partner violence:  Fear of current or ex partner: Not on file    Emotionally abused: Not on file    Physically abused: Not on file    Forced sexual activity: Not on file  Other Topics Concern  . Not on file  Social History Narrative  . Not on file    ROS General: Denies fever, chills, night sweats, changes in weight, changes in appetite HEENT: Denies headaches, ear pain, changes in vision, rhinorrhea, sore throat CV: Denies CP, palpitations, orthopnea  +SOB Pulm: Denies cough, wheezing  +SOB GI: Denies abdominal pain, nausea, vomiting, diarrhea, constipation GU: Denies dysuria, hematuria, frequency, vaginal discharge Msk: Denies muscle cramps, joint pains Neuro: Denies weakness, numbness, tingling Skin: Denies rashes, bruising   +eczema Psych: Denies depression, anxiety, hallucinations  BP 120/70 (BP Location: Right Arm, Patient Position: Sitting, Cuff Size: Normal)   Pulse 92   Temp 98.3 F (36.8 C) (Oral)   Ht 5\' 9"  (1.753 m)   Wt 173 lb (78.5 kg)   SpO2 98%   BMI 25.55 kg/m   Physical Exam Gen. Pleasant, well developed, well-nourished, in NAD HEENT - Wearing glasses, [place]/AT, PERRL, no scleral icterus, no nasal drainage, pharynx without erythema or exudate.  TMs normal b/l. Lungs: no use of accessory muscles, CTAB, no wheezes, rales or rhonchi Cardiovascular: RRR, No r/g/m, no peripheral edema Abdomen: BS present, soft, nontender,nondistended Neuro:  A&Ox3, CN II-XII intact, normal gait Skin:  Warm, dry, intact.  Extremely dry, hypertrophic, hyperpigmented plaques on entire body, excluding face.    No results found for this or any previous visit (from the past 2160 hour(s)).  Assessment/Plan: Eczema, unspecified type  -severe -discussed tips to help keep skin moisturized. -given handouts - Plan: Ambulatory referral to Dermatology  Weight loss  -7 lb weight loss since Sept 2019 - Plan: CBC (no diff), TSH, T4, Free, Comprehensive metabolic panel, Hemoglobin A1c  SOB (shortness of breath) -stable  -reassured as pO2 98%  -discussed possible causes  Encounter to establish care  -We reviewed the PMH, PSH, FH, SH, Meds and Allergies. -We provided refills for any medications we will prescribe as needed. -We addressed current concerns per orders and patient instructions. -We have asked for records for pertinent exams, studies, vaccines and notes from previous providers. -We have advised patient to follow up per instructions below.  F/u in the next wk or so for remaining concerns.  Unable to address pt's list 2/2 time.  Abbe Amsterdam, MD

## 2018-03-14 NOTE — Patient Instructions (Signed)
Shortness of Breath, Adult  Shortness of breath is when a person has trouble breathing enough air or when a person feels like she or he is having trouble breathing in enough air. Shortness of breath could be a sign of a medical problem.  Follow these instructions at home:     Pay attention to any changes in your symptoms.   Do not use any products that contain nicotine or tobacco, such as cigarettes, e-cigarettes, and chewing tobacco.   Do not smoke. Smoking is a common cause of shortness of breath. If you need help quitting, ask your health care provider.   Avoid things that can irritate your airways, such as:  ? Mold.  ? Dust.  ? Air pollution.  ? Chemical fumes.  ? Things that can cause allergy symptoms (allergens), if you have allergies.   Keep your living space clean and free of mold and dust.   Rest as needed. Slowly return to your usual activities.   Take over-the-counter and prescription medicines only as told by your health care provider. This includes oxygen therapy and inhaled medicines.   Keep all follow-up visits as told by your health care provider. This is important.  Contact a health care provider if:   Your condition does not improve as soon as expected.   You have a hard time doing your normal activities, even after you rest.   You have new symptoms.  Get help right away if:   Your shortness of breath gets worse.   You have shortness of breath when you are resting.   You feel light-headed or you faint.   You have a cough that is not controlled with medicines.   You cough up blood.   You have pain with breathing.   You have pain in your chest, arms, shoulders, or abdomen.   You have a fever.   You cannot walk up stairs or exercise the way that you normally do.  These symptoms may represent a serious problem that is an emergency. Do not wait to see if the symptoms will go away. Get medical help right away. Call your local emergency services (911 in the [place] Do not drive yourself  to the hospital.  Summary   Shortness of breath is when a person has trouble breathing enough air. It can be a sign of a medical problem.   Avoid things that irritate your lungs, such as smoking, pollution, mold, and dust.   Pay attention to changes in your symptoms and contact your health care provider if you have a hard time completing daily activities because of shortness of breath.  This information is not intended to replace advice given to you by your health care provider. Make sure you discuss any questions you have with your health care provider.  Document Released: 10/10/2000 Document Revised: 06/17/2017 Document Reviewed: 06/17/2017  Elsevier Interactive Patient Education  2019 [employer] Inc[employer]

## 2018-03-20 ENCOUNTER — Encounter: Payer: Self-pay | Admitting: Family Medicine

## 2018-03-20 DIAGNOSIS — L309 Dermatitis, unspecified: Secondary | ICD-10-CM | POA: Insufficient documentation

## 2018-04-04 ENCOUNTER — Ambulatory Visit (INDEPENDENT_AMBULATORY_CARE_PROVIDER_SITE_OTHER): Payer: BLUE CROSS/BLUE SHIELD | Admitting: Family Medicine

## 2018-04-04 ENCOUNTER — Encounter: Payer: Self-pay | Admitting: Family Medicine

## 2018-04-04 ENCOUNTER — Ambulatory Visit (INDEPENDENT_AMBULATORY_CARE_PROVIDER_SITE_OTHER): Payer: BLUE CROSS/BLUE SHIELD

## 2018-04-04 VITALS — BP 110/64 | HR 96 | Temp 98.2°F | Wt 174.0 lb

## 2018-04-04 DIAGNOSIS — R61 Generalized hyperhidrosis: Secondary | ICD-10-CM | POA: Diagnosis not present

## 2018-04-04 DIAGNOSIS — L308 Other specified dermatitis: Secondary | ICD-10-CM

## 2018-04-04 DIAGNOSIS — L0100 Impetigo, unspecified: Secondary | ICD-10-CM | POA: Diagnosis not present

## 2018-04-04 DIAGNOSIS — R634 Abnormal weight loss: Secondary | ICD-10-CM | POA: Diagnosis not present

## 2018-04-04 MED ORDER — TRIAMCINOLONE ACETONIDE 0.025 % EX OINT: 1.0000 "application " | TOPICAL_OINTMENT | Freq: Two times a day (BID) | CUTANEOUS | 0 refills | Status: DC

## 2018-04-04 MED ORDER — MUPIROCIN 2 % EX OINT: 1.0000 "application " | TOPICAL_OINTMENT | Freq: Two times a day (BID) | CUTANEOUS | 0 refills | Status: AC

## 2018-04-04 NOTE — Patient Instructions (Addendum)
Impetigo, Adult Impetigo is an infection of the skin. It commonly occurs in young children, but it can also occur in adults. The infection causes itchy blisters and sores that produce brownish-yellow fluid. As the fluid dries, it forms a thick, honey-colored crust. These skin changes usually occur on the face, but they can also affect other areas of the body. Impetigo usually goes away in 7-10 days with treatment. What are the causes? This condition is caused by two types of bacteria. It may be caused by staphylococci or streptococci bacteria. These bacteria cause impetigo when they get under the surface of the skin. This often happens after some damage to the skin, such as:  Cuts, scrapes, or scratches.  Rashes.  Insect bites, especially when you scratch the area of a bite.  Chickenpox or other illnesses that cause open skin sores.  Nail biting or chewing. Impetigo can spread easily from one person to another (is contagious). It may be spread through close skin contact or by sharing towels, clothing, or other items that an infected person has touched. What increases the risk? The following factors may make you more likely to develop this condition:  Playing sports that include skin-to-skin contact with others.  Having a skin condition with open sores, such as chickenpox.  Having diabetes.  Having a weak body defense system (immune system).  Having many skin cuts or scrapes.  Living in an area that has high humidity levels.  Having poor hygiene.  Having high levels of staphylococci in your nose. What are the signs or symptoms? The main symptom of this condition is small blisters, often on the face around the mouth and nose. In time, the blisters break open and turn into tiny sores (lesions) with a yellow crust. In some cases, the blisters cause itching or burning. With scratching, irritation, or lack of treatment, these small lesions may get larger. Other possible symptoms  include:  Larger blisters.  Pus.  Swollen lymph glands. Scratching the affected area can cause impetigo to spread to other parts of the body. The bacteria can get under the fingernails and spread when you touch another area of your skin. How is this diagnosed? This condition is usually diagnosed during a physical exam. A skin sample or a sample of fluid from a blister may be taken for lab tests that involve growing bacteria (culture test). Lab tests can help to confirm the diagnosis or help to determine the best treatment. How is this treated? Treatment for this condition depends on the severity of the condition:  Mild impetigo can be treated with prescription antibiotic cream.  Oral antibiotic medicine may be used in more severe cases.  Medicines that reduce itchiness (antihistamines)may also be used. Follow these instructions at home: Medicines  Take over-the-counter and prescription medicines only as told by your health care provider.  Apply or take your antibiotic as told by your health care provider. Do not stop using the antibiotic even if your condition improves. General instructions   To help prevent impetigo from spreading to other body areas: ? Keep your fingernails short and clean. ? Do not scratch the blisters or sores. ? Cover infected areas, if necessary, to keep from scratching. ? Wash your hands often with soap and warm water.  Before applying antibiotic cream or ointment, you should: ? Gently wash the infected areas with antibacterial soap and warm water. ? Soak crusted areas in warm, soapy water using antibacterial soap. ? Gently rub the areas to remove crusts. Do not   scrub.  Do not share towels.  Wash your clothing and bedsheets in warm water that is 140F (60C) or warmer.  Stay home until you have used an antibiotic cream for 48 hours (2 days) or an oral antibiotic medicine for 24 hours (1 day). You should only return to work and activities with other  people if your skin shows significant improvement. ? You may return to contact sports after you have used antibiotic medicine for 72 hours (3 days).  Keep all follow-up visits as told by your health care provider. This is important. How is this prevented?  Wash your hands often with soap and warm water.  Do not share towels, washcloths, clothing, bedding, or razors.  Keep your fingernails short.  Keep any cuts, scrapes, bug bites, or rashes clean and covered.  Use insect repellent to prevent bug bites. Contact a health care provider if:  You develop more blisters or sores even with treatment.  Other family members get sores.  Your skin sores are not improving after 72 hours (3 days) of treatment.  You have a fever. Get help right away if:  You see spreading redness or swelling of the skin around your sores.  You see red streaks coming from your sores.  You develop a sore throat.  The area around your rash becomes warm, red, or tender to the touch.  You have dark, reddish-brown urine.  You do not urinate often or you urinate small amounts.  You are very tired (lethargic).  You have swelling in the face, hands, or feet. Summary  Impetigo is a skin infection that causes itchy blisters and sores that produce brownish-yellow fluid. As the fluid dries, it forms a crust.  This condition is caused by staphylococci or streptococci bacteria. These bacteria cause impetigo when they get under the surface of the skin, such as through cuts, rashes, bug bites, or open sores.  Treatment for this condition may include antibiotic ointment or oral antibiotics.  To help prevent impetigo from spreading to other body areas, make sure you keep your fingernails short, avoid scratching, cover any blisters, and wash your hands often.  If you have impetigo, stay home until you have used an antibiotic cream for 48 hours (2 days) or an oral antibiotic medicine for 24 hours (1 day). You should  only return to work and activities with other people if your skin shows significant improvement. This information is not intended to replace advice given to you by your health care provider. Make sure you discuss any questions you have with your health care provider. Document Released: 02/05/2014 Document Revised: 02/07/2016 Document Reviewed: 02/07/2016 Elsevier Interactive Patient Education  2019 Elsevier Inc.  Eczema Eczema is a broad term for a group of skin conditions that cause skin to become rough and inflamed. Each type of eczema has different triggers, symptoms, and treatments. Eczema of any type is usually itchy and symptoms range from mild to severe. Eczema and its symptoms are not spread from person to person (are not contagious). It can appear on different parts of the body at different times. Your eczema may not look the same as someone else's eczema. What are the types of eczema? Atopic dermatitis This is a long-term (chronic) skin disease that keeps coming back (recurring). Usual symptoms are dry skin and small, solid pimples that may swell and leak fluid (weep). Contact dermatitis  This happens when something irritates the skin and causes a rash. The irritation can come from substances that you are allergic  to (allergens), such as poison ivy, chemicals, or medicines that were applied to your skin. Dyshidrotic eczema This is a form of eczema on the hands and feet. It shows up as very itchy, fluid-filled blisters. It can affect people of any age, but is more common before age 67. Hand eczema  This causes very itchy areas of skin on the palms and sides of the hands and fingers. This type of eczema is common in industrial jobs where you may be exposed to many different types of irritants. Lichen simplex chronicus This type of eczema occurs when a person constantly scratches one area of the body. Repeated scratching of the area leads to thickened skin (lichenification). Lichen  simplex chronicus can occur along with other types of eczema. It is more common in adults, but may be seen in children as well. Nummular eczema This is a common type of eczema. It has no known cause. It typically causes a red, circular, crusty lesion (plaque) that may be itchy. Scratching may become a habit and can cause bleeding. Nummular eczema occurs most often in people of middle-age or older. It most often affects the hands. Seborrheic dermatitis This is a common skin disease that mainly affects the scalp. It may also affect any oily areas of the body, such as the face, sides of nose, eyebrows, ears, eyelids, and chest. It is marked by small scaling and redness of the skin (erythema). This can affect people of all ages. In infants, this condition is known as Location manager." Stasis dermatitis This is a common skin disease that usually appears on the legs and feet. It most often occurs in people who have a condition that prevents blood from being pumped through the veins in the legs (chronic venous insufficiency). Stasis dermatitis is a chronic condition that needs long-term management. How is eczema diagnosed? Your health care provider will examine your skin and review your medical history. He or she may also give you skin patch tests. These tests involve taking patches that contain possible allergens and placing them on your back. He or she will then check in a few days to see if an allergic reaction occurred. What are the common treatments? Treatment for eczema is based on the type of eczema you have. Hydrocortisone steroid medicine can relieve itching quickly and help reduce inflammation. This medicine may be prescribed or obtained over-the-counter, depending on the strength of the medicine that is needed. Follow these instructions at home:  Take over-the-counter and prescription medicines only as told by your health care provider.  Use creams or ointments to moisturize your skin. Do not use  lotions.  Learn what triggers or irritates your symptoms. Avoid these things.  Treat symptom flare-ups quickly.  Do not itch your skin. This can make your rash worse.  Keep all follow-up visits as told by your health care provider. This is important. Where to find more information  The American Academy of Dermatology: InfoExam.si  The National Eczema Association: www.nationaleczema.org Contact a health care provider if:  You have serious itching, even with treatment.  You regularly scratch your skin until it bleeds.  Your rash looks different than usual.  Your skin is painful, swollen, or more red than usual.  You have a fever. Summary  There are eight general types of eczema. Each type has different triggers.  Eczema of any type causes itching that may range from mild to severe.  Treatment varies based on the type of eczema you have. Hydrocortisone steroid medicine can help  with itching and inflammation.  Protecting your skin is the best way to prevent eczema. Use moisturizers and lotions. Avoid triggers and irritants, and treat flare-ups quickly. This information is not intended to replace advice given to you by your health care provider. Make sure you discuss any questions you have with your health care provider. Document Released: 05/31/2016 Document Revised: 05/31/2016 Document Reviewed: 05/31/2016 Elsevier Interactive Patient Education  2019 [employer].

## 2018-04-04 NOTE — Progress Notes (Signed)
Subjective:    Patient ID: Joshua Bentley, male    DOB: 05-31-1990, 28 y.o.   MRN: [id number]  No chief complaint on file.   HPI Patient was seen today for follow-up in acute concern.  Pt endorses continued worsening eczema.  Pt was on prednisone 10 mg daily but recently ran out.  Pt notes his skin has become worse.  Skin cracking and painful.  Pt has a follow-up appointment with dermatology in April.  Pt states he put Vaseline on, vitamin E oil, and another lubricant prior to leaving his home for this appointment but his skin is already dry.  Pt notes having a pimple on his upper lip several days ago.  The pimple has since spread and become a weeping lesion on his upper lip.  Pt has been covering his mouth while at work because the area looks so bad.  Pt is concerned as he is in a wedding this wknd.  Pt also notes night sweats, weight loss, decreased appetite, decreased muscle mass x " a while".  Pt denies coughing, sick contacts.  Pt has to change his sheets frequently when sleeping in his bed 2/2 increased sweating.  Patient states symptoms are better if he does not sleep in the bed.  Typically sweats 4 nights per week  Past Medical History:  Diagnosis Date  . Eczema     No Known Allergies  ROS General: Denies fever, chills, night sweats, changes in weight, changes in appetite HEENT: Denies headaches, ear pain, changes in vision, rhinorrhea, sore throat CV: Denies CP, palpitations, SOB, orthopnea Pulm: Denies SOB, cough, wheezing GI: Denies abdominal pain, nausea, vomiting, diarrhea, constipation GU: Denies dysuria, hematuria, frequency, vaginal discharge Msk: Denies muscle cramps, joint pains Neuro: Denies weakness, numbness, tingling Skin: Denies rashes, bruising  +eczema, rash of lip Psych: Denies depression, anxiety, hallucinations    Objective:    Blood pressure 110/64, pulse 96, temperature 98.2 F (36.8 C), weight 174 lb (78.9 kg), SpO2 98 %.  Gen. Pleasant,  well-nourished, in no distress, normal affect   HEENT: Meta/AT, face symmetric, conjunctiva clear, no scleral icterus, PERRLA, EOMI, nares patent without drainage Lungs: no accessory muscle use, CTAB, no wheezes or rales Cardiovascular: RRR, no m/r/g, no peripheral edema Neuro:  A&Ox3, CN II-XII intact, normal gait Skin:  Warm, extremely dry, hypertrophic, rough, cracked skin on extremities, and abdomen.  Yellow, honey crusted, oozing lesion on upper lip.     Wt Readings from Last 3 Encounters:  03/14/18 173 lb (78.5 kg)  10/25/17 180 lb 3.2 oz (81.7 kg)  04/17/17 176 lb (79.8 kg)    Lab Results  Component Value Date   WBC 7.3 03/14/2018   HGB 14.8 03/14/2018   HCT 44.6 03/14/2018   PLT 308.0 03/14/2018   GLUCOSE 63 (L) 03/14/2018   ALT 14 03/14/2018   AST 21 03/14/2018   NA 141 03/14/2018   K 4.6 03/14/2018   CL 104 03/14/2018   CREATININE 0.77 03/14/2018   BUN 8 03/14/2018   CO2 30 03/14/2018   TSH 0.80 03/14/2018   HGBA1C 4.7 03/14/2018    Assessment/Plan:  Impetigo -Given handout - Plan: mupirocin ointment (BACTROBAN) 2 %  Other eczema -Dermatology appointment in April.  Contact to see if patient can be seen sooner given severity dz. -Discussed various management options to treat eczema.  Considered Dupixent -Given handouts - Plan: triamcinolone (KENALOG) 0.025 % ointment  Night sweats  - Plan: DG Chest 2 View  F/u 2 to 4 weeks  Margorie Renner, MD 

## 2018-04-18 ENCOUNTER — Ambulatory Visit: Payer: BLUE CROSS/BLUE SHIELD | Admitting: Family Medicine

## 2018-04-23 ENCOUNTER — Telehealth: Payer: Self-pay | Admitting: Family Medicine

## 2018-04-23 NOTE — Telephone Encounter (Signed)
Belva Bertin faxed over an assessment to have filled out for the patients employer.  Fax form to: [phone] ATTN: Belva Bertin  Disposition: Dr's folder

## 2018-04-29 NOTE — Telephone Encounter (Signed)
Pt form has been received and placed on Dr Salomon Fick folder

## 2018-04-30 ENCOUNTER — Telehealth: Payer: Self-pay | Admitting: *Deleted

## 2018-04-30 NOTE — Telephone Encounter (Signed)
Called patient in regard to needing clarification on why he is needing paper work done. Per Dr. Salomon Fick she is not sure why patient needs accommodation including a day off and regular days off? LVM for patient to return call. CMR created.

## 2018-05-01 NOTE — Telephone Encounter (Signed)
Pt should discuss this further with Dermatology. Given severity of eczema, pt should strongly consider Dupixent.  Unclear if pt's Derm appointment was rescheduled 2/2 COVID-19.

## 2018-05-01 NOTE — Telephone Encounter (Signed)
Patient returned call. Per patient his  regularly scheduled days off are Friday and Saturday. Patient is  needing like an additional day off  everyother day due body aches which causes a breakdown in his skin with his eczema

## 2018-05-02 NOTE — Telephone Encounter (Signed)
Pt called back in and stated that his appt for the new Derm is around the end of April. He stated they will probably want to see him before they will do anything.  He wanted to know what he should do til then ?   [phone]

## 2018-05-02 NOTE — Telephone Encounter (Signed)
Attempted to call patient to let him know per Dr. Salomon Fick Pt should discuss this further with Dermatology. Given severity of eczema, pt should strongly consider Dupixent.  Unclear if pt's Derm appointment was rescheduled 2/2 COVID-19.  Patient did not answer. LVM for return call. CRM created.

## 2018-05-07 NOTE — Telephone Encounter (Signed)
Called pt left a detailed message to call the office with more information regarding his Assessment form

## 2018-05-13 NOTE — Telephone Encounter (Signed)
Called pt regarding his form left a VM to return call in the office

## 2018-06-11 DIAGNOSIS — L209 Atopic dermatitis, unspecified: Secondary | ICD-10-CM | POA: Diagnosis not present

## 2018-06-11 DIAGNOSIS — L739 Follicular disorder, unspecified: Secondary | ICD-10-CM | POA: Diagnosis not present

## 2018-06-11 DIAGNOSIS — L280 Lichen simplex chronicus: Secondary | ICD-10-CM | POA: Diagnosis not present

## 2018-06-11 DIAGNOSIS — L299 Pruritus, unspecified: Secondary | ICD-10-CM | POA: Diagnosis not present

## 2018-06-15 DIAGNOSIS — H109 Unspecified conjunctivitis: Secondary | ICD-10-CM | POA: Diagnosis not present

## 2018-07-10 NOTE — Telephone Encounter (Signed)
Attempted to call pt again left details why Dr Volanda Napoleon did not complete the Assessment form dropped off at the office. Numerous VM left on pt mobile number with no response.

## 2019-04-24 ENCOUNTER — Emergency Department (HOSPITAL_COMMUNITY): Payer: BC Managed Care – PPO

## 2019-04-24 ENCOUNTER — Other Ambulatory Visit: Payer: Self-pay

## 2019-04-24 ENCOUNTER — Encounter (HOSPITAL_COMMUNITY): Payer: Self-pay | Admitting: Emergency Medicine

## 2019-04-24 ENCOUNTER — Emergency Department (HOSPITAL_COMMUNITY)
Admission: EM | Admit: 2019-04-24 | Discharge: 2019-04-24 | Disposition: A | Discharge status: Home / Self Care | Payer: BC Managed Care – PPO | Attending: Emergency Medicine | Admitting: Emergency Medicine

## 2019-04-24 DIAGNOSIS — Y92310 Basketball court as the place of occurrence of the external cause: Secondary | ICD-10-CM | POA: Diagnosis not present

## 2019-04-24 DIAGNOSIS — Y999 Unspecified external cause status: Secondary | ICD-10-CM | POA: Diagnosis not present

## 2019-04-24 DIAGNOSIS — S86002A Unspecified injury of left Achilles tendon, initial encounter: Secondary | ICD-10-CM

## 2019-04-24 DIAGNOSIS — X509XXA Other and unspecified overexertion or strenuous movements or postures, initial encounter: Secondary | ICD-10-CM | POA: Diagnosis not present

## 2019-04-24 DIAGNOSIS — S99912A Unspecified injury of left ankle, initial encounter: Secondary | ICD-10-CM | POA: Diagnosis present

## 2019-04-24 DIAGNOSIS — Y9367 Activity, basketball: Secondary | ICD-10-CM | POA: Insufficient documentation

## 2019-04-24 NOTE — ED Notes (Signed)
Patient verbalizes understanding of discharge instructions. Opportunity for questioning and answers were provided. Armband removed by staff, pt discharged from ED.  

## 2019-04-24 NOTE — ED Notes (Signed)
Ice pack placed on posterior left ankle

## 2019-04-24 NOTE — ED Triage Notes (Signed)
Pt c/o L ankle pain onset today after landing on it while playing basketball.

## 2019-04-24 NOTE — Progress Notes (Signed)
Orthopedic Tech Progress Note Patient Details:  Joshua Bentley 18-Jun-1990 [id number]  Ortho Devices Type of Ortho Device: Short leg splint Ortho Device/Splint Location: LLE Ortho Device/Splint Interventions: Application   Post Interventions Patient Tolerated: Well Instructions Provided: Care of device   Joshua Bentley 04/24/2019, 10:42 PM

## 2019-04-24 NOTE — ED Provider Notes (Signed)
[facility] EMERGENCY DEPARTMENT Provider Note   CSN: [id number] Arrival date & time: 04/24/19  1915     History Chief Complaint  Patient presents with  . Ankle Pain    Joshua Bentley is a 29 y.o. male with past medical history significant for eczema presents to emergency department today with chief complaint of cute onset of left ankle pain.  Patient states he was playing basketball when he jumped to make a basket.  He landed and heard a pop in his ankle.  He fell to the ground denies hitting his head or loss of consciousness.Marland Kitchen  He is reporting constant aching pain in his left ankle and calf.  He rates the pain 5 out of 10 in severity.  He has not taken any medications for symptoms prior to arrival.  He is able to bear weight on his left leg and ambulate without difficulty.  Denies history of previous ankle injuries.  Denies fever, chills, head injury, headache, visual changes, neck pain, numbness, tingling, weakness.  History provided by patient with additional history obtained from chart review.     Past Medical History:  Diagnosis Date  . Eczema     Patient Active Problem List   Diagnosis Date Noted  . Eczema 03/20/2018    Past Surgical History:  Procedure Laterality Date  . ESOPHAGOGASTRODUODENOSCOPY N/A 07/08/2016   Procedure: ESOPHAGOGASTRODUODENOSCOPY (EGD);  Surgeon: Kathi Der, MD;  Location: [facility];  Service: Gastroenterology;  Laterality: N/A;       Family History  Problem Relation Age of Onset  . Cancer Maternal Grandmother   . Breast cancer Maternal Grandmother     Social History   Tobacco Use  . Smoking status: Never Smoker  . Smokeless tobacco: Never Used  Substance Use Topics  . Alcohol use: Yes    Comment: social  . Drug use: No    Home Medications Prior to Admission medications   Medication Sig Start Date End Date Taking? Authorizing Provider  hydrOXYzine (ATARAX/VISTARIL) 10 MG tablet Take 1 tablet (10  mg total) by mouth 3 (three) times daily as needed for itching. Patient not taking: Reported on 04/24/2019 10/25/17   Kallie Locks, FNP  predniSONE (DELTASONE) 10 MG tablet Take 1 tablet (10 mg total) by mouth daily with breakfast. Patient not taking: Reported on 04/24/2019 10/25/17   Kallie Locks, FNP  triamcinolone (KENALOG) 0.025 % ointment Apply 1 application topically 2 (two) times daily. Patient not taking: Reported on 04/24/2019 04/04/18   Deeann Saint, MD    Allergies    Patient has no known allergies.  Review of Systems   Review of Systems  All other systems are reviewed and are negative for acute change except as noted in the HPI.  Physical Exam Updated Vital Signs BP 107/80 (BP Location: Left Arm)   Pulse 100   Temp 98.6 F (37 C) (Oral)   Resp 16   SpO2 97%   Physical Exam Vitals and nursing note reviewed.  Constitutional:      Appearance: He is well-developed. He is not ill-appearing or toxic-appearing.  HENT:     Head: Normocephalic and atraumatic.     Nose: Nose normal.  Eyes:     General: No scleral icterus.       Right eye: No discharge.        Left eye: No discharge.     Conjunctiva/sclera: Conjunctivae normal.  Neck:     Vascular: No JVD.  Cardiovascular:  Rate and Rhythm: Normal rate and regular rhythm.     Pulses: Normal pulses.          Dorsalis pedis pulses are 2+ on the right side and 2+ on the left side.     Heart sounds: Normal heart sounds.  Pulmonary:     Effort: Pulmonary effort is normal.     Breath sounds: Normal breath sounds.  Abdominal:     General: There is no distension.  Musculoskeletal:        General: Normal range of motion.     Cervical back: Normal range of motion.     Comments: There is no swelling and tenderness over the lateral malleolus. No overt deformity. No tenderness over the medial aspect of the ankle. The fifth metatarsal is not tender. The ankle joint is intact without excessive opening on stressing.   Positive Thompson test on left lower extremity.  Patient can plantarflex and dorsiflex with pain.  He can bear weight on left lower extremity. Neurovascularly intact distally. Compartments soft above and below affected joint.   Ambulates with mildly antalgic gait.  Skin:    General: Skin is warm and dry.  Neurological:     Mental Status: He is oriented to person, place, and time.     GCS: GCS eye subscore is 4. GCS verbal subscore is 5. GCS motor subscore is 6.     Comments: Fluent speech, no facial droop.  Psychiatric:        Behavior: Behavior normal.     ED Results / Procedures / Treatments   Labs (all labs ordered are listed, but only abnormal results are displayed) Labs Reviewed - No data to display  EKG None  Radiology DG Ankle Complete Left  Result Date: 04/24/2019 CLINICAL DATA:  29 year old male with posterior ankle pain after basketball injury. EXAM: LEFT ANKLE COMPLETE - 3+ VIEW COMPARISON:  None. FINDINGS: Bone mineralization is within normal limits. Preserved mortise joint alignment. Talar dome intact. There is no evidence of fracture, dislocation, or joint effusion. There is no evidence of arthropathy or other focal bone abnormality. Calcaneus and other visible bones of the left foot appear within normal limits. IMPRESSION: No acute fracture or dislocation identified about the left ankle. Electronically Signed   By: Genevie Ann M.D.   On: 04/24/2019 20:37    Procedures Procedures (including critical care time)  Medications Ordered in ED Medications - No data to display  ED Course  I have reviewed the triage vital signs and the nursing notes.  Pertinent labs & imaging results that were available during my care of the patient were reviewed by me and considered in my medical decision making (see chart for details).    MDM Rules/Calculators/A&P                      Patient presents to the ED with complaints of pain to the left ankle s/p injury mechanical fall. Exam  without obvious deformity or open wounds. ROM intact. Tender to palpation of left calf.  Positive Thompson test on left lower extremity however patient can plantarflex and dorsiflex as well as bear weight on his left lower extremity.  These exam findings are not consistent with complete Achilles tendon rupture.  Suspect partial tear or sprain.  NVI distally. Xray of left ankle negative for fracture/dislocation. Therapeutic splint and crutches provided.  I reassessed patient after splint applied and he is neurovascularly intact.  PRICE and motrin recommended. I discussed results, treatment plan,  need for follow-up with orthopedics, and return precautions with the patient. Provided opportunity for questions, patient confirmed understanding and are in agreement with plan.    Portions of this note were generated with Scientist, clinical (histocompatibility and immunogenetics). Dictation errors may occur despite best attempts at proofreading.   Final Clinical Impression(s) / ED Diagnoses Final diagnoses:  Injury of left Achilles tendon, initial encounter    Rx / DC Orders ED Discharge Orders    None       Kathyrn Lass 04/24/19 2221    Wynetta Fines, MD 05/01/19 [phone]

## 2019-04-24 NOTE — Discharge Instructions (Addendum)
You have been seen today for ankle pain. Please read and follow all provided instructions. Return to the emergency room for worsening condition or new concerning symptoms.    1. Medications:  Recommend you take Tylenol and ibuprofen for pain as needed.  Continue usual home medications Take medications as prescribed. Please review all of the medicines and only take them if you do not have an allergy to them.   2. Treatment: rest.  Wear the splint until you follow up with orthopedist doctor.  Use crutches and do not bear weight on your left leg.  3. Follow Up:  Please follow up the on-call orthopedist Dr. Linna Caprice.  Call his office first thing Monday morning to schedule a follow-up appointment.   It is also a possibility that you have an allergic reaction to any of the medicines that you have been prescribed - Everybody reacts differently to medications and while MOST people have no trouble with most medicines, you may have a reaction such as nausea, vomiting, rash, swelling, shortness of breath. If this is the case, please stop taking the medicine immediately and contact your physician.  ?

## 2020-10-09 ENCOUNTER — Emergency Department (HOSPITAL_COMMUNITY)
Admission: EM | Admit: 2020-10-09 | Discharge: 2020-10-10 | Disposition: A | Discharge status: Home / Self Care | Payer: 59 | Attending: Emergency Medicine | Admitting: Emergency Medicine

## 2020-10-09 ENCOUNTER — Other Ambulatory Visit: Payer: Self-pay

## 2020-10-09 ENCOUNTER — Encounter (HOSPITAL_COMMUNITY): Payer: Self-pay | Admitting: Emergency Medicine

## 2020-10-09 DIAGNOSIS — L0390 Cellulitis, unspecified: Secondary | ICD-10-CM

## 2020-10-09 DIAGNOSIS — R21 Rash and other nonspecific skin eruption: Secondary | ICD-10-CM | POA: Diagnosis present

## 2020-10-09 DIAGNOSIS — L309 Dermatitis, unspecified: Secondary | ICD-10-CM

## 2020-10-09 NOTE — ED Triage Notes (Signed)
Pt c/o "knot" to the left groin. States it has been there "a while" but has gotten larger. Denies pain.

## 2020-10-10 ENCOUNTER — Encounter (HOSPITAL_COMMUNITY): Payer: Self-pay | Admitting: Emergency Medicine

## 2020-10-10 LAB — URINALYSIS, ROUTINE W REFLEX MICROSCOPIC
Bilirubin Urine: NEGATIVE
Glucose, UA: NEGATIVE mg/dL
Hgb urine dipstick: NEGATIVE
Ketones, ur: NEGATIVE mg/dL
Leukocytes,Ua: NEGATIVE
Nitrite: NEGATIVE
Protein, ur: NEGATIVE mg/dL
Specific Gravity, Urine: 1.025 (ref 1.005–1.030)
pH: 6 (ref 5.0–8.0)

## 2020-10-10 MED ORDER — CETIRIZINE HCL 10 MG PO TABS: 10.0000 mg | ORAL_TABLET | Freq: Every day | ORAL | 0 refills | Status: AC

## 2020-10-10 MED ORDER — TRIAMCINOLONE ACETONIDE 0.5 % EX OINT: 1.0000 "application " | TOPICAL_OINTMENT | Freq: Two times a day (BID) | CUTANEOUS | 0 refills | Status: AC

## 2020-10-10 MED ORDER — LANOLIN HYDROUS EX OINT: 1.0000 "application " | TOPICAL_OINTMENT | Freq: Two times a day (BID) | CUTANEOUS | 1 refills | Status: AC | PRN

## 2020-10-10 MED ORDER — CEPHALEXIN 500 MG PO CAPS: 500.0000 mg | ORAL_CAPSULE | Freq: Four times a day (QID) | ORAL | 0 refills | Status: AC

## 2020-10-10 MED ORDER — METHOCARBAMOL 500 MG PO TABS
1000.0000 mg | ORAL_TABLET | Freq: Once | ORAL | Status: AC
  Administered 2020-10-10: 1000 mg via ORAL
  Filled 2020-10-10: qty 2

## 2020-10-10 MED ORDER — IBUPROFEN 600 MG PO TABS
600.0000 mg | ORAL_TABLET | Freq: Four times a day (QID) | ORAL | 0 refills | Status: AC | PRN
Start: 2020-10-10 — End: ?

## 2020-10-10 MED ORDER — IBUPROFEN 800 MG PO TABS
800.0000 mg | ORAL_TABLET | Freq: Once | ORAL | Status: AC
  Administered 2020-10-10: 800 mg via ORAL
  Filled 2020-10-10: qty 1

## 2020-10-10 NOTE — ED Notes (Signed)
Patient discharge instructions reviewed with the patient. The patient verbalized understanding of instructions. Patient discharged. 

## 2020-10-10 NOTE — Discharge Instructions (Addendum)
Avoid hot showers or baths, use unscented soaps and detergents, avoid scratching eczema.

## 2020-10-10 NOTE — ED Provider Notes (Signed)
[facility] EMERGENCY DEPARTMENT Provider Note   CSN: [id number] Arrival date & time: 10/09/20  1800     History Chief Complaint  Patient presents with   Groin Swelling    Joshua Bentley is a 30 y.o. male.  30 yo male with history of eczema reports to ED with complaint of eczema/rash.  Patient with enlarged lymph node to his left inguinal region.  Patient has chronic eczema, diffusely, sees dermatology regularly.  Next dermatology appointment is next month.  Reports he ran out of his emollient lotion and is requesting a refill, worsening itching diffusely.  No mucous membrane involvement.  No nausea or vomiting.  Tolerate oral intake without difficulty.  No fevers or chills.  Patient does have increased irritation to his left knee due to scratching. He is ambulatory without difficulty. No dysuria or hematuria, no testicle or scrotal pain/swelling. No sick contacts or recent travel.   The history is provided by the patient. No language interpreter was used.      Past Medical History:  Diagnosis Date   Eczema     Patient Active Problem List   Diagnosis Date Noted   Eczema 03/20/2018    Past Surgical History:  Procedure Laterality Date   ESOPHAGOGASTRODUODENOSCOPY N/A 07/08/2016   Procedure: ESOPHAGOGASTRODUODENOSCOPY (EGD);  Surgeon: Kathi Der, MD;  Location: [facility];  Service: Gastroenterology;  Laterality: N/A;       Family History  Problem Relation Age of Onset   Cancer Maternal Grandmother    Breast cancer Maternal Grandmother     Social History   Tobacco Use   Smoking status: Never   Smokeless tobacco: Never  Substance Use Topics   Alcohol use: Yes    Comment: social   Drug use: No    Home Medications Prior to Admission medications   Medication Sig Start Date End Date Taking? Authorizing Provider  cephALEXin (KEFLEX) 500 MG capsule Take 1 capsule (500 mg total) by mouth 4 (four) times daily for 7 days. 10/10/20 10/17/20  Yes Sloan Leiter, DO  cetirizine (ZYRTEC ALLERGY) 10 MG tablet Take 1 tablet (10 mg total) by mouth daily. 10/10/20 11/09/20 Yes Tanda Rockers A, DO  ibuprofen (ADVIL) 600 MG tablet Take 1 tablet (600 mg total) by mouth every 6 (six) hours as needed. 10/10/20  Yes Tanda Rockers A, DO  lanolin OINT Apply 1 application topically 2 (two) times daily as needed. 10/10/20 11/09/20 Yes Tanda Rockers A, DO  triamcinolone ointment (KENALOG) 0.5 % Apply 1 application topically 2 (two) times daily for 7 days. 10/10/20 10/17/20 Yes Tanda Rockers A, DO  hydrOXYzine (ATARAX/VISTARIL) 10 MG tablet Take 1 tablet (10 mg total) by mouth 3 (three) times daily as needed for itching. Patient not taking: Reported on 04/24/2019 10/25/17   Kallie Locks, FNP  predniSONE (DELTASONE) 10 MG tablet Take 1 tablet (10 mg total) by mouth daily with breakfast. Patient not taking: Reported on 04/24/2019 10/25/17   Kallie Locks, FNP    Allergies    Patient has no known allergies.  Review of Systems   Review of Systems  Constitutional:  Negative for chills and fever.  HENT:  Negative for facial swelling and trouble swallowing.   Eyes:  Negative for photophobia and visual disturbance.  Respiratory:  Negative for cough and shortness of breath.   Cardiovascular:  Negative for chest pain and palpitations.  Gastrointestinal:  Negative for abdominal pain, nausea and vomiting.  Endocrine: Negative for polydipsia and polyuria.  Genitourinary:  Negative for difficulty urinating and hematuria.  Musculoskeletal:  Negative for gait problem and joint swelling.  Skin:  Positive for rash and wound. Negative for pallor.  Neurological:  Negative for syncope and headaches.  Psychiatric/Behavioral:  Negative for agitation and confusion.    Physical Exam Updated Vital Signs BP 117/73 (BP Location: Left Arm)   Pulse 81   Temp 98.2 F (36.8 C) (Oral)   Resp 17   SpO2 100%   Physical Exam Vitals and nursing note reviewed.   Constitutional:      General: He is not in acute distress.    Appearance: He is well-developed.  HENT:     Head: Normocephalic and atraumatic.     Right Ear: External ear normal.     Left Ear: External ear normal.     Mouth/Throat:     Mouth: Mucous membranes are moist.  Eyes:     General: No scleral icterus. Cardiovascular:     Rate and Rhythm: Normal rate and regular rhythm.     Pulses: Normal pulses.     Heart sounds: Normal heart sounds.  Pulmonary:     Effort: Pulmonary effort is normal. No respiratory distress.     Breath sounds: Normal breath sounds.  Abdominal:     General: Abdomen is flat. Bowel sounds are normal.     Palpations: Abdomen is soft.     Tenderness: There is no abdominal tenderness. There is no right CVA tenderness or left CVA tenderness.  Musculoskeletal:        General: Normal range of motion.       Arms:     Cervical back: Normal range of motion.     Right lower leg: No edema.     Left lower leg: No edema.  Skin:    General: Skin is warm and dry.     Capillary Refill: Capillary refill takes less than 2 seconds.     Comments: Eczema diffusely. Excoriations to his left knee, minimal erythema. Enlarged left inguinal lymph node.   Neurological:     Mental Status: He is alert and oriented to person, place, and time.  Psychiatric:        Mood and Affect: Mood normal.        Behavior: Behavior normal.    ED Results / Procedures / Treatments   Labs (all labs ordered are listed, but only abnormal results are displayed) Labs Reviewed  URINALYSIS, ROUTINE W REFLEX MICROSCOPIC  GC/CHLAMYDIA PROBE AMP ([facility]) NOT AT [facility]    EKG None  Radiology No results found.  Procedures Procedures   Medications Ordered in ED Medications  ibuprofen (ADVIL) tablet 800 mg (has no administration in time range)  methocarbamol (ROBAXIN) tablet 1,000 mg (has no administration in time range)    ED Course  I have reviewed the triage vital signs and the  nursing notes.  Pertinent labs & imaging results that were available during my care of the patient were reviewed by me and considered in my medical decision making (see chart for details).    MDM Rules/Calculators/A&P                           30 yo male with history and exam as above. Physical exam is consistent with diffuse eczema. He has mild excoriations to his left knee. Mild paraspinal muscle tenderness to the lower left. Vital signs reviewed and are stable, he is no acute distress. Serious etiology considered.  UA  without evidence of acute infection, STI testing is pending. No penile or scrotal abnormalities on exam or history.    He has mild excoriations to his left knee, no diffuse streaking or drainage. He has mildly inflamed lymph node to left inguinal region. No cervical LN enlargement palpated. No night sweats/bruising easily/unexpected weight changes. Question of mild cellulitis to LLE. Will start keflex empirically.   Pt with mild paraspinal muscle tenderness. Patient presents with low back pain without signs of spinal cord compression, cauda equina syndrome, infection, aneurysm, or other serious etiology. The patient is neurologically intact. Given the extremely low risk of these diagnoses further testing and evaluation for these possibilities does not appear to be indicated at this time. Favor MSK etiology. Give motrin/robaxin.  Pt with diffuse eczema, start topical steroid and emolient. Given skin care precautions and recommendations. He has appointment with dermatology next month; advised him to try and see derm sooner for  follow up. No mucus membrane involvement. Doubt SJS, TEN, or other life threatening rash.  The patient improved significantly and was discharged in stable condition. Detailed discussions were had with the patient regarding current findings, and need for close f/u with PCP or on call doctor. The patient has been instructed to return immediately if the  symptoms worsen in any way for re-evaluation. Patient verbalized understanding and is in agreement with current care plan. All questions answered prior to discharge.   Final Clinical Impression(s) / ED Diagnoses Final diagnoses:  Eczema, unspecified type  Cellulitis, unspecified cellulitis site    Rx / DC Orders ED Discharge Orders          Ordered    cephALEXin (KEFLEX) 500 MG capsule  4 times daily        10/10/20 0827    ibuprofen (ADVIL) 600 MG tablet  Every 6 hours PRN        10/10/20 0827    triamcinolone ointment (KENALOG) 0.5 %  2 times daily        10/10/20 0827    lanolin OINT  2 times daily PRN        10/10/20 0827    cetirizine (ZYRTEC ALLERGY) 10 MG tablet  Daily        10/10/20 0827             Sloan Leiter, DO 10/10/20 [phone]

## 2020-10-11 LAB — GC/CHLAMYDIA PROBE AMP (~~LOC~~) NOT AT ARMC
Chlamydia: NEGATIVE
Comment: NEGATIVE
Comment: NORMAL
Neisseria Gonorrhea: NEGATIVE

## 2021-08-15 IMAGING — DX DG ANKLE COMPLETE 3+V*L*
3 series · 3 of 3 positions shown · non-contrast
Comparison: None.

CLINICAL DATA: 28-year-old male with posterior ankle pain after
basketball injury.

EXAM:
LEFT ANKLE COMPLETE - 3+ VIEW

[ankle ap]
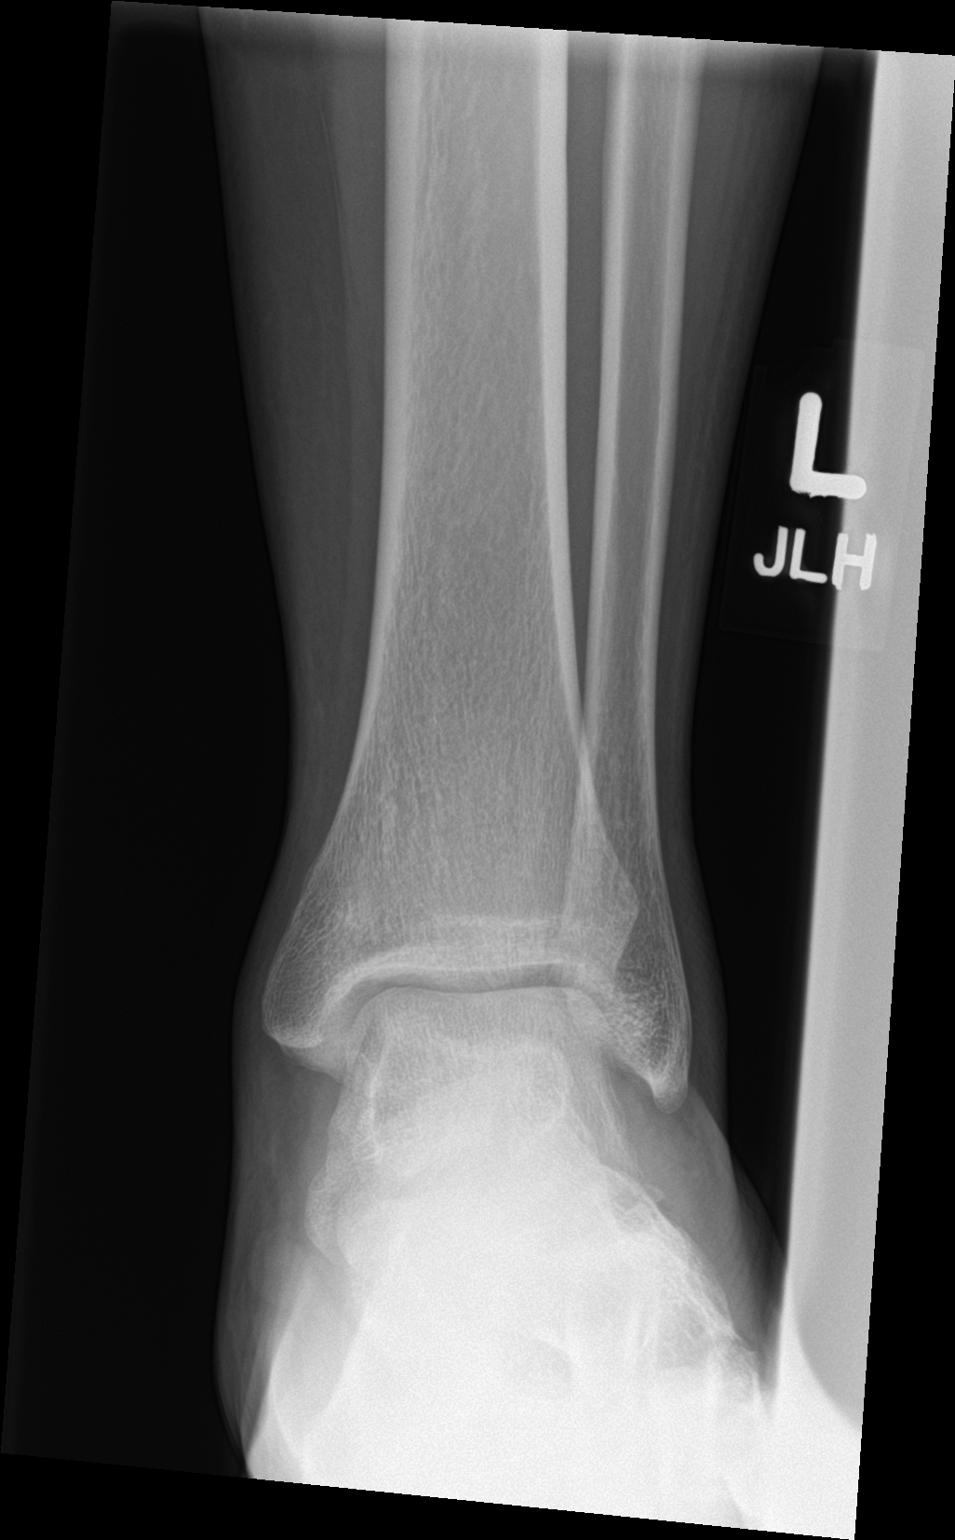

[ankle obl]
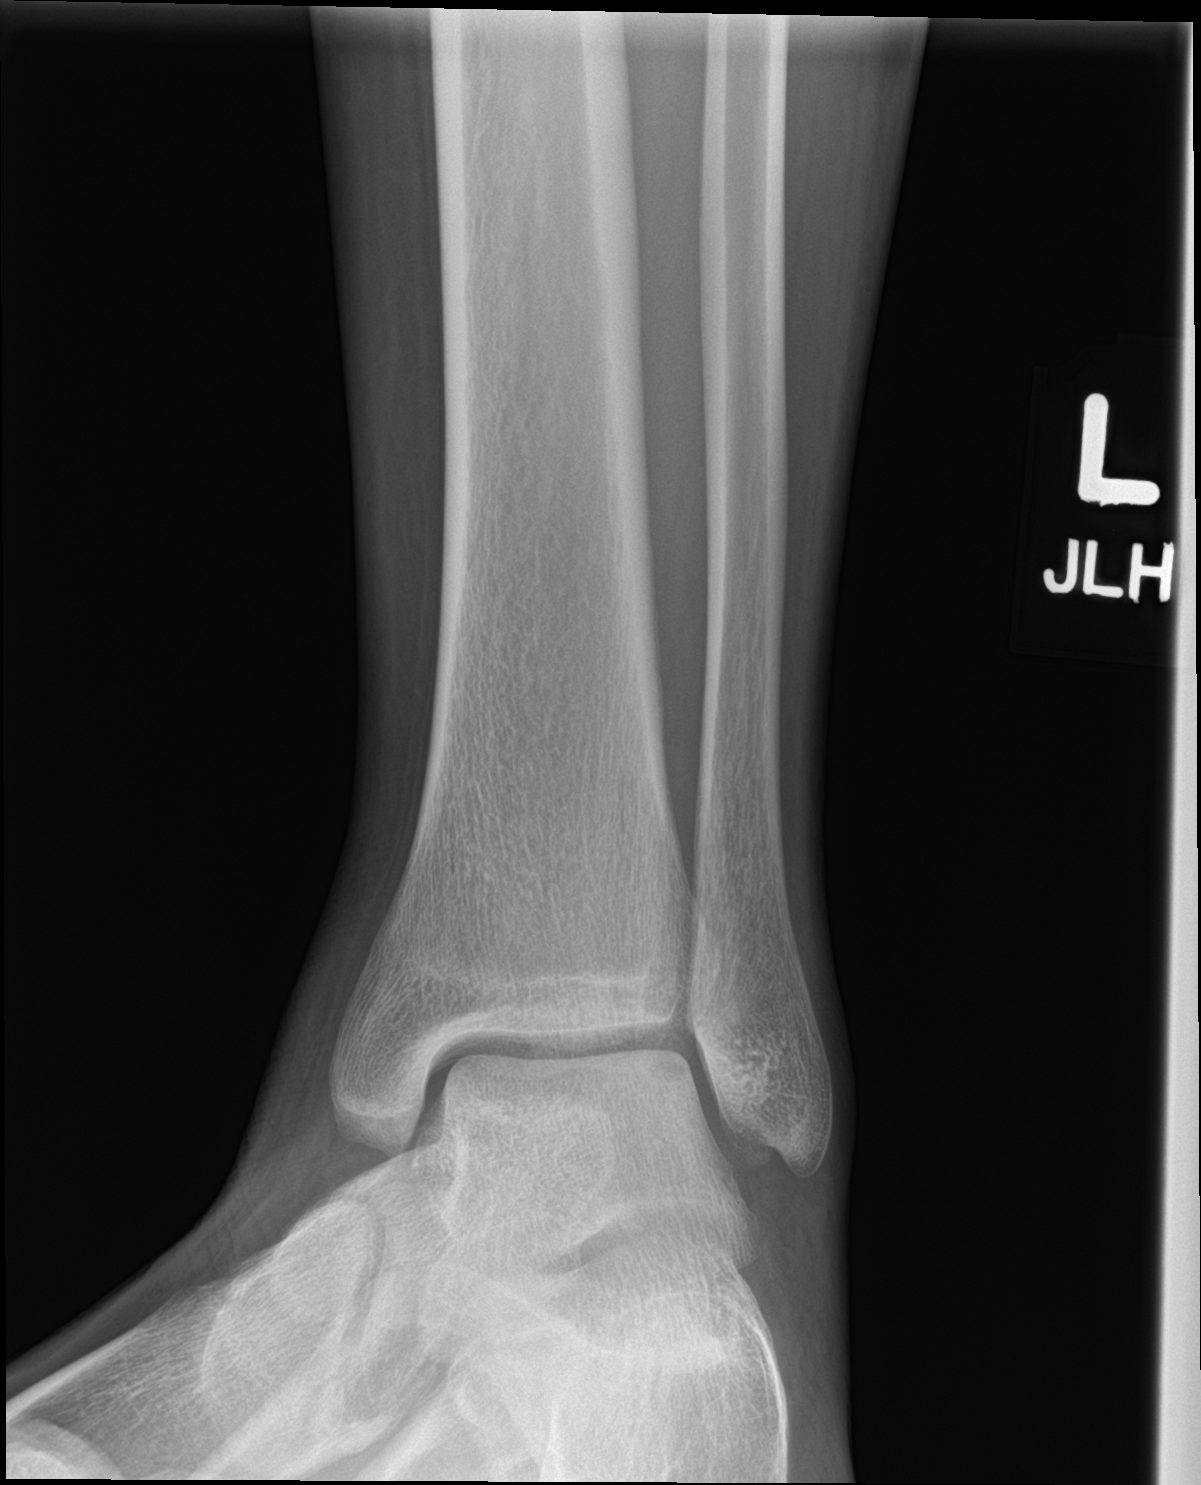

[ankle lat]
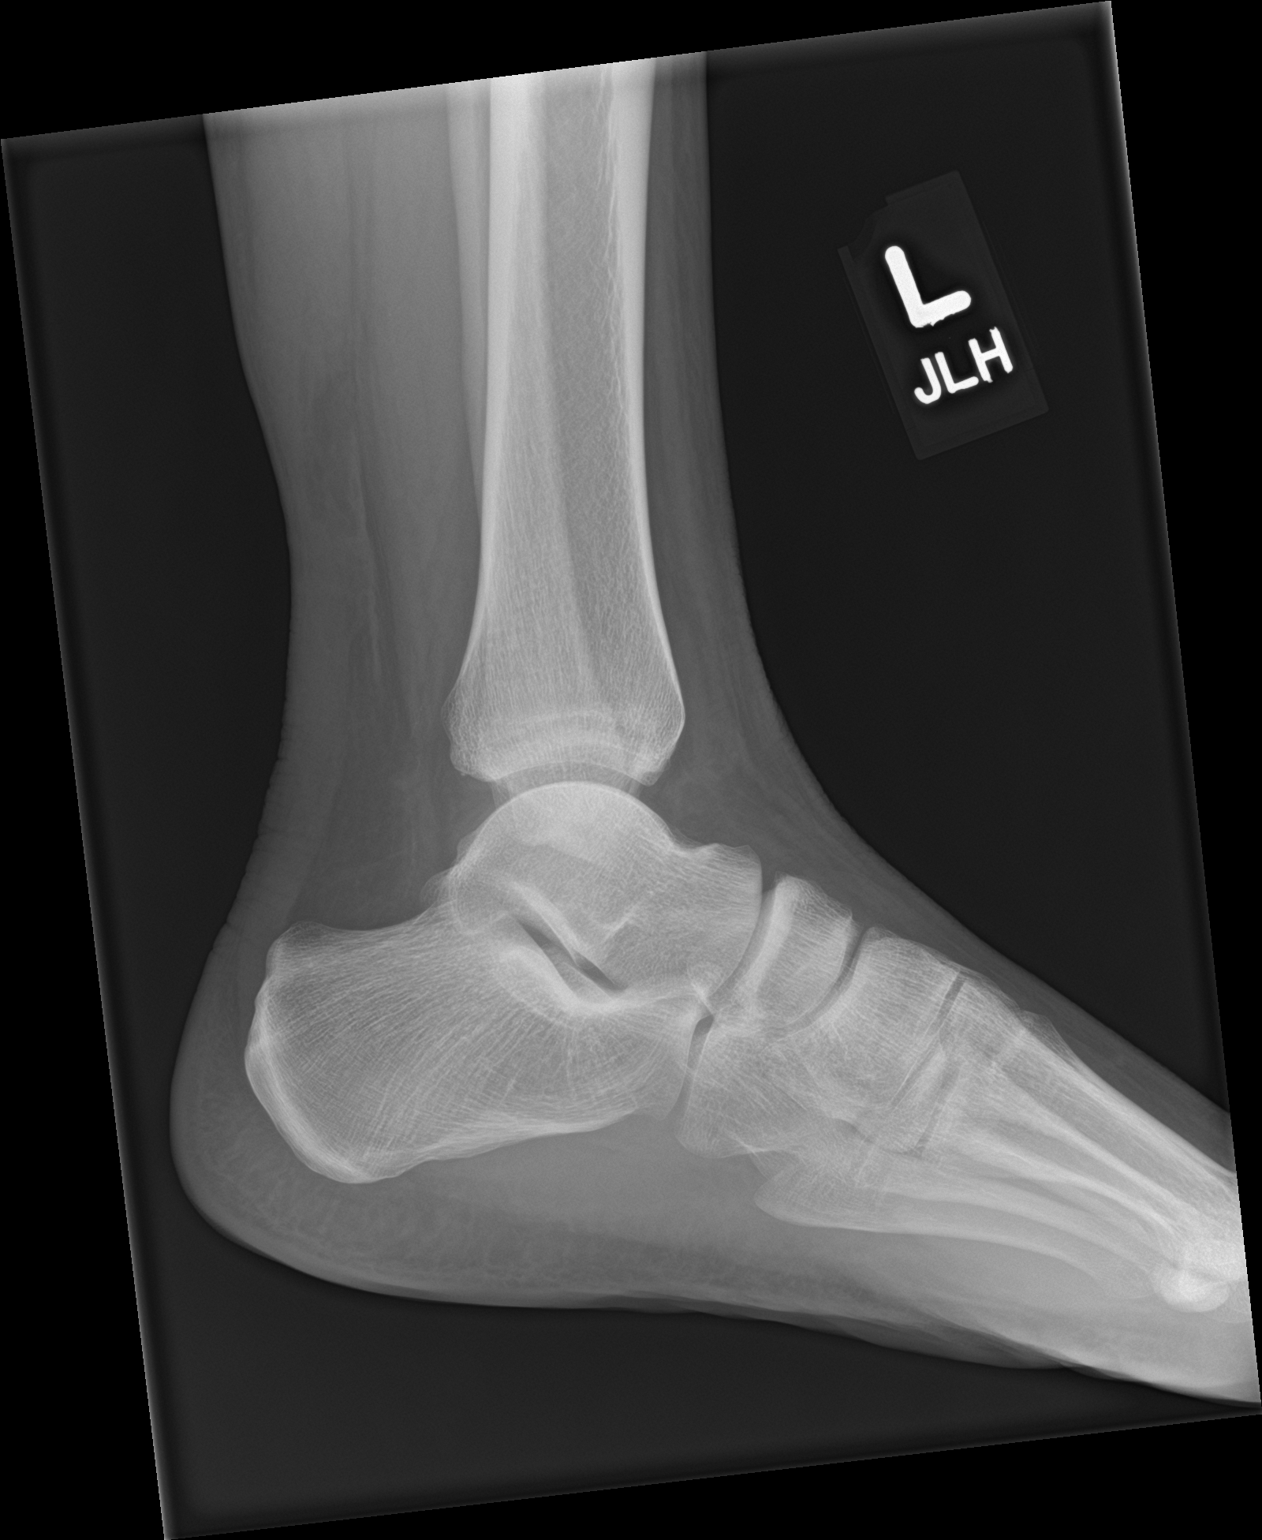

[3 of 3 positions shown; findings below may reference images not displayed]

FINDINGS: Bone mineralization is within normal limits. Preserved mortise joint
alignment. Talar dome intact. There is no evidence of fracture,
dislocation, or joint effusion. There is no evidence of arthropathy
or other focal bone abnormality. Calcaneus and other visible bones
of the left foot appear within normal limits.
IMPRESSION: No acute fracture or dislocation identified about the left ankle.
